# Patient Record
Sex: Male | Born: 1945 | Marital: Married | Smoking: Former smoker
Health system: Southern US, Community
[De-identification: ages and names within clinical notes are randomized; demographics above are authoritative.]

## PROBLEM LIST (undated history)

## (undated) DIAGNOSIS — I10 Essential (primary) hypertension: Secondary | ICD-10-CM

## (undated) HISTORY — PX: HERNIA REPAIR: SHX51

## (undated) HISTORY — DX: Essential (primary) hypertension: I10

---

## 2020-01-28 DIAGNOSIS — Z79899 Other long term (current) drug therapy: Secondary | ICD-10-CM | POA: Insufficient documentation

## 2020-01-28 DIAGNOSIS — G894 Chronic pain syndrome: Secondary | ICD-10-CM | POA: Insufficient documentation

## 2020-01-28 DIAGNOSIS — M899 Disorder of bone, unspecified: Secondary | ICD-10-CM | POA: Insufficient documentation

## 2020-01-28 DIAGNOSIS — Z789 Other specified health status: Secondary | ICD-10-CM | POA: Insufficient documentation

## 2020-01-28 NOTE — Patient Instructions (Addendum)
____________________________________________________________________________________________  General Risks and Possible Complications  Patient Responsibilities: It is important that you read this as it is part of your informed consent. It is our duty to inform you of the risks and possible complications associated with treatments offered to you. It is your responsibility as a patient to read this and to ask questions about anything that is not clear or that you believe was not covered in this document.  Patient's Rights: You have the right to refuse treatment. You also have the right to change your mind, even after initially having agreed to have the treatment done. However, under this last option, if you wait until the last second to change your mind, you may be charged for the materials used up to that point.  Introduction: Medicine is not an exact science. Everything in Medicine, including the lack of treatment(s), carries the potential for danger, harm, or loss (which is by definition: Risk). In Medicine, a complication is a secondary problem, condition, or disease that can aggravate an already existing one. All treatments carry the risk of possible complications. The fact that a side effects or complications occurs, does not imply that the treatment was conducted incorrectly. It must be clearly understood that these can happen even when everything is done following the highest safety standards.  No treatment: You can choose not to proceed with the proposed treatment alternative. The "PRO(s)" would include: avoiding the risk of complications associated with the therapy. The "CON(s)" would include: not getting any of the treatment benefits. These benefits fall under one of three categories: diagnostic; therapeutic; and/or palliative. Diagnostic benefits include: getting information which can ultimately lead to improvement of the disease or symptom(s). Therapeutic benefits are those associated with the  successful treatment of the disease. Finally, palliative benefits are those related to the decrease of the primary symptoms, without necessarily curing the condition (example: decreasing the pain from a flare-up of a chronic condition, such as incurable terminal cancer).  General Risks and Complications: These are associated to most interventional treatments. They can occur alone, or in combination. They fall under one of the following six (6) categories: no benefit or worsening of symptoms; bleeding; infection; nerve damage; allergic reactions; and/or death. 1. No benefits or worsening of symptoms: In Medicine there are no guarantees, only probabilities. No healthcare provider can ever guarantee that a medical treatment will work, they can only state the probability that it may. Furthermore, there is always the possibility that the condition may worsen, either directly, or indirectly, as a consequence of the treatment. 2. Bleeding: This is more common if the patient is taking a blood thinner, either prescription or over the counter (example: Goody Powders, Fish oil, Aspirin, Garlic, etc.), or if suffering a condition associated with impaired coagulation (example: Hemophilia, cirrhosis of the liver, low platelet counts, etc.). However, even if you do not have one on these, it can still happen. If you have any of these conditions, or take one of these drugs, make sure to notify your treating physician. 3. Infection: This is more common in patients with a compromised immune system, either due to disease (example: diabetes, cancer, human immunodeficiency virus [HIV], etc.), or due to medications or treatments (example: therapies used to treat cancer and rheumatological diseases). However, even if you do not have one on these, it can still happen. If you have any of these conditions, or take one of these drugs, make sure to notify your treating physician. 4. Nerve Damage: This is more common when the   treatment is  an invasive one, but it can also happen with the use of medications, such as those used in the treatment of cancer. The damage can occur to small secondary nerves, or to large primary ones, such as those in the spinal cord and brain. This damage may be temporary or permanent and it may lead to impairments that can range from temporary numbness to permanent paralysis and/or brain death. 5. Allergic Reactions: Any time a substance or material comes in contact with our body, there is the possibility of an allergic reaction. These can range from a mild skin rash (contact dermatitis) to a severe systemic reaction (anaphylactic reaction), which can result in death. 6. Death: In general, any medical intervention can result in death, most of the time due to an unforeseen complication. ____________________________________________________________________________________________  ____________________________________________________________________________________________  Preparing for Procedure with Sedation  Procedure appointments are limited to planned procedures: . No Prescription Refills. . No disability issues will be discussed. . No medication changes will be discussed.  Instructions: . Oral Intake: Do not eat or drink anything for at least 8 hours prior to your procedure. (Exception: Blood Pressure Medication. See below.) . Transportation: Unless otherwise stated by your physician, you may drive yourself after the procedure. . Blood Pressure Medicine: Do not forget to take your blood pressure medicine with a sip of water the morning of the procedure. If your Diastolic (lower reading)is above 100 mmHg, elective cases will be cancelled/rescheduled. . Blood thinners: These will need to be stopped for procedures. Notify our staff if you are taking any blood thinners. Depending on which one you take, there will be specific instructions on how and when to stop it. . Diabetics on insulin: Notify the staff  so that you can be scheduled 1st case in the morning. If your diabetes requires high dose insulin, take only  of your normal insulin dose the morning of the procedure and notify the staff that you have done so. . Preventing infections: Shower with an antibacterial soap the morning of your procedure. . Build-up your immune system: Take 1000 mg of Vitamin C with every meal (3 times a day) the day prior to your procedure. . Antibiotics: Inform the staff if you have a condition or reason that requires you to take antibiotics before dental procedures. . Pregnancy: If you are pregnant, call and cancel the procedure. . Sickness: If you have a cold, fever, or any active infections, call and cancel the procedure. . Arrival: You must be in the facility at least 30 minutes prior to your scheduled procedure. . Children: Do not bring children with you. . Dress appropriately: Bring dark clothing that you would not mind if they get stained. . Valuables: Do not bring any jewelry or valuables.  Reasons to call and reschedule or cancel your procedure: (Following these recommendations will minimize the risk of a serious complication.) . Surgeries: Avoid having procedures within 2 weeks of any surgery. (Avoid for 2 weeks before or after any surgery). . Flu Shots: Avoid having procedures within 2 weeks of a flu shots or . (Avoid for 2 weeks before or after immunizations). . Barium: Avoid having a procedure within 7-10 days after having had a radiological study involving the use of radiological contrast. (Myelograms, Barium swallow or enema study). . Heart attacks: Avoid any elective procedures or surgeries for the initial 6 months after a "Myocardial Infarction" (Heart Attack). . Blood thinners: It is imperative that you stop these medications before procedures. Let us know if you   if you take any blood thinner.  . Infection: Avoid procedures during or within two weeks of an infection (including chest colds or  gastrointestinal problems). Symptoms associated with infections include: Localized redness, fever, chills, night sweats or profuse sweating, burning sensation when voiding, cough, congestion, stuffiness, runny nose, sore throat, diarrhea, nausea, vomiting, cold or Flu symptoms, recent or current infections. It is specially important if the infection is over the area that we intend to treat. Marland Kitchen Heart and lung problems: Symptoms that may suggest an active cardiopulmonary problem include: cough, chest pain, breathing difficulties or shortness of breath, dizziness, ankle swelling, uncontrolled high or unusually low blood pressure, and/or palpitations. If you are experiencing any of these symptoms, cancel your procedure and contact your primary care physician for an evaluation.  Remember:  Regular Business hours are:  Monday to Thursday 8:00 AM to 4:00 PM  Provider's Schedule: Milinda Pointer, MD:  Procedure days: Tuesday and Thursday 7:30 AM to 4:00 PM  Gillis Santa, MD:  Procedure days: Monday and Wednesday 7:30 AM to 4:00 PM ____________________________________________________________________________________________   ____________________________________________________________________________________________  Drug Holidays (Slow)  What is a "Drug Holiday"? Drug Holiday: is the name given to the period of time during which a patient stops taking a medication(s) for the purpose of eliminating tolerance to the drug.  Benefits . Improved effectiveness of opioids. . Decreased opioid dose needed to achieve benefits. . Improved pain with lesser dose.  What is tolerance? Tolerance: is the progressive decreased in effectiveness of a drug due to its repetitive use. With repetitive use, the body gets use to the medication and as a consequence, it loses its effectiveness. This is a common problem seen with opioid pain medications. As a result, a larger dose of the drug is needed to achieve the same  effect that used to be obtained with a smaller dose.  How long should a "Drug Holiday" last? You should stay off of the pain medicine for at least 14 consecutive days. (2 weeks)  Should I stop the medicine "cold [place]"? No. You should always coordinate with your Pain Specialist so that he/she can provide you with the correct medication dose to make the transition as smoothly as possible.  How do I stop the medicine? Slowly. You will be instructed to decrease the daily amount of pills that you take by one (1) pill every seven (7) days. This is called a "slow downward taper" of your dose. For example: if you normally take four (4) pills per day, you will be asked to drop this dose to three (3) pills per day for seven (7) days, then to two (2) pills per day for seven (7) days, then to one (1) per day for seven (7) days, and at the end of those last seven (7) days, this is when the "Drug Holiday" would start.   Will I have withdrawals? By doing a "slow downward taper" like this one, it is unlikely that you will experience any significant withdrawal symptoms. Typically, what triggers withdrawals is the sudden stop of a high dose opioid therapy. Withdrawals can usually be avoided by slowly decreasing the dose over a prolonged period of time.  What are withdrawals? Withdrawals: refers to the wide range of symptoms that occur after stopping or dramatically reducing opiate drugs after heavy and prolonged use. Withdrawal symptoms do not occur to patients that use low dose opioids, or those who take the medication sporadically. Contrary to benzodiazepine (example: Valium, Xanax, etc.) or alcohol withdrawals ("Delirium Tremens"), opioid withdrawals  are not lethal. Withdrawals are the physical manifestation of the body getting rid of the excess receptors.  Expected Symptoms Early symptoms of withdrawal may include: . Agitation . Anxiety . Muscle aches . Increased tearing . Insomnia . Runny  nose . Sweating . Yawning  Late symptoms of withdrawal may include: . Abdominal cramping . Diarrhea . Dilated pupils . Goose bumps . Nausea . Vomiting  Will I experience withdrawals? Due to the slow nature of the taper, it is very unlikely that you will experience any.  What is a slow taper? Taper: refers to the gradual decrease in dose.  ___________________________________________________________________________________________  Please get your x-rays done as soon as possible.

## 2020-01-28 NOTE — Progress Notes (Signed)
Patient: Albert Gordon  Service Category: E/M  Provider: Gaspar Cola, MD  DOB: [birth year]  DOS: 01/29/2020  Referring Provider: [facility]  MRN: [id number]  Setting: Ambulatory outpatient  PCP: Ferdie Ping, MD  Type: New Patient  Specialty: Interventional Pain Management    Location: Office  Delivery: Face-to-face     Primary Reason(s) for Visit: Encounter for initial evaluation of one or more chronic problems (new to examiner) potentially causing chronic pain, and posing a threat to normal musculoskeletal function. (Level of risk: High) CC: Back Pain (lower)  HPI  [name] is a 74 y.o. year old, male patient, who comes today to see [place] for the first time for an initial evaluation of his chronic pain. He has Chronic pain syndrome; Pharmacologic therapy; Disorder of skeletal system; Problems influencing health status; Chronic low back pain (1ry area of Pain) (Bilateral) (L>R) w/o sciatica; Lumbar facet syndrome (Bilateral) (L>R); Chronic lower extremity pain (Bilateral) (L>R); Chronic hip pain (Left); Chronic sacroiliac joint pain (Bilateral) (L>R); and Somatic dysfunction of sacroiliac joints (Bilateral) on their problem list. Today he comes in for evaluation of his Back Pain (lower)  Pain Assessment: Location: Lower Back Radiating: both legs to just above the knee Onset: More than a month ago Duration: Chronic pain Quality: Other (Comment) (agonizing) Severity: 6 /10 (subjective, self-reported pain score)  Note: Reported level is compatible with observation.                         When using our objective Pain Scale, levels between 6 and 10/10 are said to belong in an emergency room, as it progressively worsens from a 6/10, described as severely limiting, requiring emergency care not usually available at an outpatient pain management facility. At a 6/10 level, communication becomes difficult and requires great effort. Assistance to reach the emergency department may be  required. Facial flushing and profuse sweating along with potentially dangerous increases in heart rate and blood pressure will be evident. Effect on ADL: difficulty performing daily activities Timing: Constant Modifying factors: medications BP: 128/74  HR: (!) 58  Onset and Duration: Present longer than 3 months Cause of pain: Unknown Severity: Getting worse, NAS-11 at its worse: 10/10, NAS-11 at its best: 4/10, NAS-11 now: 4/10 and NAS-11 on the average: 6/10  Aggravating Factors: Prolonged standing Alleviating Factors: Hot packs, Medications and Warm showers or baths Associated Problems: Night-time cramps, Fatigue and Pain that wakes patient up Quality of Pain: Agonizing, Deep, Dreadful and Shooting Previous Examinations or Tests: MRI scan and Chiropractic evaluation Previous Treatments: Chiropractic manipulations, Epidural steroid injections, Narcotic medications and Stretching exercises  The patient comes into the clinics today for the first time for a chronic pain management evaluation.  According to the patient the primary area of pain is that of the lower back, bilaterally, with the pain started in the midline.  Enter left more than right.  The patient denies any surgeries, but does admit to nerve blocks approximately 2 to 3 years ago.  He indicates that they did not help.  He does admit to having had 2 MRIs at the [facility] hospital in [place] the last of which was approximately 6 months ago.  He also refers being rather active and racing his grandchildren.  The patient's secondary area pain is that of the lower extremities, bilaterally, (L>R) in the case of the left lower extremity the pain goes through the back of the leg to just above the knee.  The distribution of the pain on the right side is identical to the left facet that not as bad.  He denies having had any EMG/PNCV's but does admit to having tried some TENS units and acupuncture.  He describes not taking any type of blood  thinners.  Currently he has been managing his pain with extended release morphine close hydrocodone/APAP which he has taken nonstop for the past 8 years.  He recognizes that he has developed some tolerance to the medication.  Provocative physical exam today was positive for pain arising from the left hip on the Patrick maneuver with also bilateral sacroiliac joint pain (Bilateral) lumbar facet joint pain on hyperextension on rotation provocative maneuvers.  Straight leg raise was negative and equal bilaterally.  Lumbar flexion was within normal limits where he was able to reach to floor.  He does not seem to be having any signs or symptoms of any type of lower lumbar radiculopathy.  The patient complains of electrical-like sensations but when he was asked if he took any gabapentin (Neurontin) he indicated that he tried it and it did not work.  I then proceeded to ask him about pregabalin and he simply said that he has tried everything and nothing has worked.  Specifically with regards to the gabapentin he again said that he tried it but it did not work.  Today he indicated that the physicians at the [facility] would like to have him off of the opioids but that he has told them that he needs "something" for the pain.  I took this opportunity to talk to the patient about "Drug Holidays". I took the opportunity to explain the concept of tolerance and how "Drug Holidays" help control it.  I detailed how to do a slow taper to avoid withdrawals.  My initial impression is that this is not an idea that he wanted to entertain.  I also asked the patient if he was going to continue getting his pain medication at the [facility] since I had no intention of taking over those medications without doing a  "Drug Holiday".  Again, the body language spoke by itself.  Today I took the time to provide the patient with information regarding my pain practice. The patient was informed that my practice is divided into two  sections: an interventional pain management section, as well as a completely separate and distinct medication management section. I explained that I have procedure days for my interventional therapies, and evaluation days for follow-ups and medication management. Because of the amount of documentation required during both, they are kept separated. This means that there is the possibility that he may be scheduled for a procedure on one day, and medication management the next. I have also informed him that because of staffing and facility limitations, I no longer take patients for medication management only. To illustrate the reasons for this, I gave the patient the example of surgeons, and how inappropriate it would be to refer a patient to his/her care, just to write for the post-surgical antibiotics on a surgery done by a different surgeon.   Because interventional pain management is my board-certified specialty, the patient was informed that joining my practice means that they are open to any and all interventional therapies. I made it clear that this does not mean that they will be forced to have any procedures done. What this means is that I believe interventional therapies to be essential part of the diagnosis and proper management of chronic pain  conditions. Therefore, patients not interested in these interventional alternatives will be better served under the care of a different practitioner.  The patient was also made aware of my Comprehensive Pain Management Safety Guidelines where by joining my practice, they limit all of their nerve blocks and joint injections to those done by our practice, for as long as we are retained to manage their care.   Historic Controlled Substance Pharmacotherapy Review  PMP and historical list of controlled substances: Morphine ER 15 mg daily + hydrocodone/APAP 10/325 1 tablet every 8 hours (45 MME/day) Current opioid analgesics: Morphine ER 15 mg daily +  hydrocodone/APAP 10/325 1 tablet every 8 hours (45 MME/day)  MME/day: 45 mg/day  Medications: The patient did not bring the medication(s) to the appointment, as requested in our "New Patient Package" Pharmacodynamics: Desired effects: Analgesia: The patient reports >50% benefit. Reported improvement in function: The patient reports medication allows him to accomplish basic ADLs. Clinically meaningful improvement in function (CMIF): Sustained CMIF goals met Perceived effectiveness: Described as relatively effective, allowing for increase in activities of daily living (ADL) Undesirable effects: Side-effects or Adverse reactions: None reported Historical Monitoring: The patient  reports previous drug use. List of all UDS Test(s): No results found. List of other Serum/Urine Drug Screening Test(s):  No results found. Historical Background Evaluation: [place] PMP: PDMP reviewed during this encounter. Six (6) year initial data search conducted.             PMP NARX Score Report:  Narcotic: 412 Sedative: 150 Stimulant: 000 [place] Department of public safety, offender search: Editor, commissioning Information) Non-contributory Risk Assessment Profile: Aberrant behavior: None observed or detected today Risk factors for fatal opioid overdose: None identified today PMP NARX Overdose Risk Score: 060 Fatal overdose hazard ratio (HR): Calculation deferred Non-fatal overdose hazard ratio (HR): Calculation deferred Risk of opioid abuse or dependence: 0.7-3.0% with doses ? 36 MME/day and 6.1-26% with doses ? 120 MME/day. Substance use disorder (SUD) risk level: See below Personal History of Substance Abuse (SUD-Substance use disorder):  Alcohol: Negative  Illegal Drugs: Negative  Rx Drugs: Negative  ORT Risk Level calculation: Low Risk  Opioid Risk Tool - 01/29/20 1030      Family History of Substance Abuse   Alcohol Negative    Illegal Drugs Positive Male    Rx Drugs Negative      Personal History of Substance  Abuse   Alcohol Negative    Illegal Drugs Negative    Rx Drugs Negative      Age   Age between 74-45 years  No      History of Preadolescent Sexual Abuse   History of Preadolescent Sexual Abuse Negative or Male      Psychological Disease   Psychological Disease Negative    Depression Negative      Total Score   Opioid Risk Tool Scoring 3    Opioid Risk Interpretation Low Risk          ORT Scoring interpretation table:  Score <3 = Low Risk for SUD  Score between 4-7 = Moderate Risk for SUD  Score >8 = High Risk for Opioid Abuse   PHQ-2 Depression Scale:  Total score: 0  PHQ-2 Scoring interpretation table: (Score and probability of major depressive disorder)  Score 0 = No depression  Score 1 = 15.4% Probability  Score 2 = 21.1% Probability  Score 3 = 38.4% Probability  Score 4 = 45.5% Probability  Score 5 = 56.4% Probability  Score 6 = 78.6% Probability  PHQ-9 Depression Scale:  Total score: 0  PHQ-9 Scoring interpretation table:  Score 0-4 = No depression  Score 5-9 = Mild depression  Score 10-14 = Moderate depression  Score 15-19 = Moderately severe depression  Score 20-27 = Severe depression (2.4 times higher risk of SUD and 2.89 times higher risk of overuse)   Pharmacologic Plan: As per protocol, I have not taken over any controlled substance management, pending the results of ordered tests and/or consults.            Initial impression: Pending review of available data and ordered tests.  Meds   Current Outpatient Medications:  .  atorvastatin (LIPITOR) 80 MG tablet, Take 80 mg by mouth daily., Disp: , Rfl:  .  HYDROcodone-acetaminophen (NORCO) 10-325 MG tablet, Take 1 tablet by mouth every 8 (eight) hours as needed., Disp: , Rfl:  .  morphine (MSIR) 15 MG tablet, Take 15 mg by mouth at bedtime., Disp: , Rfl:  .  tamsulosin (FLOMAX) 0.4 MG CAPS capsule, Take 0.4 mg by mouth daily., Disp: , Rfl:  .  terazosin (HYTRIN) 2 MG capsule, Take 2 mg by mouth at  bedtime. 2 capsules at bedtimr, Disp: , Rfl:  .  triamterene-hydrochlorothiazide (MAXZIDE-25) 37.5-25 MG tablet, Take 1 tablet by mouth daily., Disp: , Rfl:   Imaging Review   Complexity Note: No results found under the [facility] record.                        ROS  Cardiovascular: High blood pressure Pulmonary or Respiratory: Snoring  Neurological: No reported neurological signs or symptoms such as seizures, abnormal skin sensations, urinary and/or fecal incontinence, being born with an abnormal open spine and/or a tethered spinal cord Psychological-Psychiatric: No reported psychological or psychiatric signs or symptoms such as difficulty sleeping, anxiety, depression, delusions or hallucinations (schizophrenial), mood swings (bipolar disorders) or suicidal ideations or attempts Gastrointestinal: No reported gastrointestinal signs or symptoms such as vomiting or evacuating blood, reflux, heartburn, alternating episodes of diarrhea and constipation, inflamed or scarred liver, or pancreas or irrregular and/or infrequent bowel movements Genitourinary: No reported renal or genitourinary signs or symptoms such as difficulty voiding or producing urine, peeing blood, non-functioning kidney, kidney stones, difficulty emptying the bladder, difficulty controlling the flow of urine, or chronic kidney disease Hematological: No reported hematological signs or symptoms such as prolonged bleeding, low or poor functioning platelets, bruising or bleeding easily, hereditary bleeding problems, low energy levels due to low hemoglobin or being anemic Endocrine: No reported endocrine signs or symptoms such as high or low blood sugar, rapid heart rate due to high thyroid levels, obesity or weight gain due to slow thyroid or thyroid disease Rheumatologic: No reported rheumatological signs and symptoms such as fatigue, joint pain, tenderness, swelling, redness, heat, stiffness, decreased range of  motion, with or without associated rash Musculoskeletal: Negative for myasthenia gravis, muscular dystrophy, multiple sclerosis or malignant hyperthermia Work History: Retired  Allergies  Mr. Delellis has No Known Allergies.  Laboratory Chemistry Profile   Renal No results found for: BUN, CREATININE, LABCREA, BCR, GFR, GFRAA, GFRNONAA, SPECGRAV, PHUR, PROTEINUR   Electrolytes No results found for: NA, K, CL, CALCIUM, MG, PHOS   Hepatic No results found for: AST, ALT, ALBUMIN, ALKPHOS, AMYLASE, LIPASE, AMMONIA   ID No results found for: LYMEIGGIGMAB, HIV, SARSCOV2NAA, STAPHAUREUS, MRSAPCR, HCVAB, PREGTESTUR, RMSFIGG, QFVRPH1IGG, QFVRPH2IGG, LYMEIGGIGMAB   Bone No results found for: [place], [id number], PI9518AC1, [id number], 25OHVITD1, 25OHVITD2, 25OHVITD3, TESTOFREE, TESTOSTERONE  Endocrine No results found for: GLUCOSE, GLUCOSEU, HGBA1C, TSH, FREET4, TESTOFREE, TESTOSTERONE, SHBG, ESTRADIOL, ESTRADIOLPCT, ESTRADIOLFRE, LABPREG, ACTH, CRTSLPL, UCORFRPERLTR, UCORFRPERDAY, CORTISOLBASE, LABPREG   Neuropathy No results found for: VITAMINB12, FOLATE, HGBA1C, HIV   CNS No results found for: COLORCSF, APPEARCSF, RBCCOUNTCSF, WBCCSF, POLYSCSF, LYMPHSCSF, EOSCSF, PROTEINCSF, GLUCCSF, JCVIRUS, CSFOLI, IGGCSF, LABACHR, ACETBL, LABACHR, ACETBL   Inflammation (CRP: Acute  ESR: Chronic) No results found for: CRP, ESRSEDRATE, LATICACIDVEN   Rheumatology No results found for: RF, ANA, LABURIC, URICUR, LYMEIGGIGMAB, LYMEABIGMQN, HLAB27   Coagulation No results found for: INR, LABPROT, APTT, PLT, DDIMER, LABHEMA, VITAMINK1, AT3   Cardiovascular No results found for: BNP, CKTOTAL, CKMB, TROPONINI, HGB, HCT, LABVMA, EPIRU, EPINEPH24HUR, NOREPRU, NOREPI24HUR, DOPARU, DOPAM24HRUR   Screening No results found for: [place], COVIDSOURCE, STAPHAUREUS, MRSAPCR, HCVAB, HIV, PREGTESTUR   Cancer No results found for: CEA, CA125, LABCA2   Allergens No results found for: ALMOND, APPLE, ASPARAGUS,  AVOCADO, BANANA, BARLEY, BASIL, BAYLE[place], GREENBEAN, LIMABEAN, WHITEBEAN, BEEFIGE, REDBEET, BLUEBERRY, BROCCOLI, CABBAGE, MELON, CARROT, CASEIN, CASHEWNUT, CAULIFLOWER, CELERY     Note: No results found under the [employer] electronic medical record  [facility]  Drug: Mr. Villwock  reports previous drug use. Alcohol:  reports current alcohol use. Tobacco:  reports that he has quit smoking. He has quit using smokeless tobacco. Medical:  has a past medical history of Hypertension. Family: family history is not on file.   The histories are not reviewed yet. Please review them in the "History" navigator section and refresh this [place]. Active Ambulatory Problems    Diagnosis Date Noted  . Chronic pain syndrome 01/28/2020  . Pharmacologic therapy 01/28/2020  . Disorder of skeletal system 01/28/2020  . Problems influencing health status 01/28/2020  . Chronic low back pain (1ry area of Pain) (Bilateral) (L>R) w/o sciatica 01/29/2020  . Lumbar facet syndrome (Bilateral) (L>R) 01/29/2020  . Chronic lower extremity pain (Bilateral) (L>R) 01/29/2020  . Chronic hip pain (Left) 01/29/2020  . Chronic sacroiliac joint pain (Bilateral) (L>R) 01/29/2020  . Somatic dysfunction of sacroiliac joints (Bilateral) 01/29/2020   Resolved Ambulatory Problems    Diagnosis Date Noted  . No Resolved Ambulatory Problems   Past Medical History:  Diagnosis Date  . Hypertension    Constitutional Exam  General appearance: Well nourished, well developed, and well hydrated. In no apparent acute distress Vitals:   01/29/20 1020  BP: 128/74  Pulse: (!) 58  Resp: 16  Temp: (!) 97.5 F (36.4 C)  TempSrc: Temporal  SpO2: 98%  Weight: 170 lb (77.1 kg)  Height: '5\' 10"'  (1.778 m)   BMI Assessment: Estimated body mass index is 24.39 kg/m as calculated from the following:   Height as of this encounter: '5\' 10"'  (1.778 m).   Weight as of this encounter: 170 lb (77.1 kg).  BMI interpretation table: BMI level  Category Range association with higher incidence of chronic pain  <18 kg/m2 Underweight   18.5-24.9 kg/m2 Ideal body weight   25-29.9 kg/m2 Overweight Increased incidence by 20%  30-34.9 kg/m2 Obese (Class I) Increased incidence by 68%  35-39.9 kg/m2 Severe obesity (Class II) Increased incidence by 136%  >40 kg/m2 Extreme obesity (Class III) Increased incidence by 254%   Patient's current BMI Ideal Body weight  Body mass index is 24.39 kg/m. Ideal body weight: 73 kg (160 lb 15 oz) Adjusted ideal body weight: 74.6 kg (164 lb 9 oz)   BMI Readings from Last 4 Encounters:  01/29/20 24.39 kg/m   Wt Readings from Last 4 Encounters:  01/29/20 170 lb (  77.1 kg)    Psych/Mental status: Alert, oriented x 3 (person, place, & time)       Eyes: PERLA Respiratory: No evidence of acute respiratory distress  Lumbar Exam  Skin & Axial Inspection: No masses, redness, or swelling Alignment: Symmetrical Functional ROM: Decreased ROM affecting both sides on hyperextension and rotation. Stability: No instability detected Muscle Tone/Strength: Guarding detected Sensory (Neurological): Movement-associated discomfort Palpation: Complains of area being tender to palpation       Provocative Tests: Hyperextension/rotation test: (+) bilaterally for facet joint pain. Lumbar quadrant test (Kemp's test): (+) bilaterally for facet joint pain. Lateral bending test: (-)       Patrick's Maneuver: (+) for bilateral S-I arthralgia and for left hip arthralgia FABER* test: (+) for bilateral S-I arthralgia and for left hip arthralgia S-I anterior distraction/compression test: deferred today         S-I lateral compression test: deferred today         S-I Thigh-thrust test: deferred today         S-I Gaenslen's test: deferred today         *(Flexion, ABduction and External Rotation)  Gait & Posture Assessment  Ambulation: Unassisted Gait: Relatively normal for age and body habitus Posture: WNL   Lower  Extremity Exam    Side: Right lower extremity  Side: Left lower extremity  Stability: No instability observed          Stability: No instability observed          Skin & Extremity Inspection: Skin color, temperature, and hair growth are WNL. No peripheral edema or cyanosis. No masses, redness, swelling, asymmetry, or associated skin lesions. No contractures.  Skin & Extremity Inspection: Skin color, temperature, and hair growth are WNL. No peripheral edema or cyanosis. No masses, redness, swelling, asymmetry, or associated skin lesions. No contractures.  Functional ROM: Decreased ROM for hip joint          Functional ROM: Decreased ROM for hip joint          Muscle Tone/Strength: Able to Toe-walk & Heel-walk without problems  Muscle Tone/Strength: Able to Toe-walk & Heel-walk without problems  Sensory (Neurological): Referred pain pattern        Sensory (Neurological): Referred pain pattern        DTR: Patellar: deferred today Achilles: deferred today Plantar: deferred today  DTR: Patellar: deferred today Achilles: deferred today Plantar: deferred today  Palpation: No palpable anomalies  Palpation: No palpable anomalies   Assessment  Primary Diagnosis & Pertinent Problem List: The primary encounter diagnosis was Chronic pain syndrome. Diagnoses of Pharmacologic therapy, Disorder of skeletal system, Problems influencing health status, Chronic low back pain (1ry area of Pain) (Bilateral) (L>R) w/o sciatica, Lumbar facet syndrome (Bilateral) (L>R), Chronic lower extremity pain (Bilateral) (L>R), Chronic hip pain (Left), Chronic sacroiliac joint pain (Bilateral) (L>R), and Somatic dysfunction of sacroiliac joints (Bilateral) were also pertinent to this visit.  Visit Diagnosis (New problems to examiner): 1. Chronic pain syndrome   2. Pharmacologic therapy   3. Disorder of skeletal system   4. Problems influencing health status   5. Chronic low back pain (1ry area of Pain) (Bilateral) (L>R) w/o  sciatica   6. Lumbar facet syndrome (Bilateral) (L>R)   7. Chronic lower extremity pain (Bilateral) (L>R)   8. Chronic hip pain (Left)   9. Chronic sacroiliac joint pain (Bilateral) (L>R)   10. Somatic dysfunction of sacroiliac joints (Bilateral)    Plan of Care (Initial workup plan)  Note: Mr.  Fortin was reminded that as per protocol, today's visit has been an evaluation only. We have not taken over the patient's controlled substance management.  Problem-specific plan: No problem-specific Assessment & Plan notes found for this encounter.   Lab Orders     Compliance Drug Analysis, Ur     Comp. Metabolic Panel (12)     Magnesium     Vitamin B12     Sedimentation rate     25-Hydroxy vitamin D Lcms D2+D3     C-reactive protein  Imaging Orders     DG Lumbar Spine Complete W/Bend     DG Si Joints     DG HIP UNILAT W OR W/O PELVIS 2-3 VIEWS LEFT Referral Orders  No referral(s) requested today    Procedure Orders     LUMBAR FACET(MEDIAL BRANCH NERVE BLOCK) MBNB Pharmacotherapy (current): Medications ordered:  No orders of the defined types were placed in this encounter.  Medications administered during this visit: Mohsin Crum. Tomasik had no medications administered during this visit.   Pharmacological management options:  Opioid Analgesics: The patient was informed that there is no guarantee that he would be a candidate for opioid analgesics. The decision will be made following CDC guidelines. This decision will be based on the results of diagnostic studies, as well as Mr. Chavira risk profile.   Membrane stabilizer: To be determined at a later time  Muscle relaxant: To be determined at a later time  NSAID: To be determined at a later time  Other analgesic(s): To be determined at a later time   Interventional management options: Mr. Hermiz was informed that there is no guarantee that he would be a candidate for interventional therapies. The decision will be based on the results of  diagnostic studies, as well as Mr. Karasik risk profile.  Procedure(s) under consideration:  Diagnostic bilateral lumbar facet block #1  Possible bilateral lumbar facet RFA  Diagnostic bilateral SI joint block #1  Possible bilateral SI joint RFA  Diagnostic left IA hip joint injection    Provider-requested follow-up: Return for Procedure (w/ sedation): (B) L-FCT BLK #1.  No future appointments.  Note by: Gaspar Cola, MD Date: 01/29/2020; Time: 4:44 PM

## 2020-01-29 ENCOUNTER — Ambulatory Visit: Payer: No Typology Code available for payment source | Attending: Pain Medicine | Admitting: Pain Medicine

## 2020-01-29 ENCOUNTER — Inpatient Hospital Stay: Admit: 2020-01-29 | Payer: No Typology Code available for payment source

## 2020-01-29 ENCOUNTER — Other Ambulatory Visit: Payer: Self-pay

## 2020-01-29 ENCOUNTER — Encounter: Payer: Self-pay | Admitting: Pain Medicine

## 2020-01-29 VITALS — BP 128/74 | HR 58 | Temp 97.5°F | Resp 16 | Ht 70.0 in | Wt 170.0 lb

## 2020-01-29 DIAGNOSIS — Z79899 Other long term (current) drug therapy: Secondary | ICD-10-CM | POA: Diagnosis not present

## 2020-01-29 DIAGNOSIS — Z789 Other specified health status: Secondary | ICD-10-CM

## 2020-01-29 DIAGNOSIS — M545 Low back pain, unspecified: Secondary | ICD-10-CM

## 2020-01-29 DIAGNOSIS — M79605 Pain in left leg: Secondary | ICD-10-CM | POA: Insufficient documentation

## 2020-01-29 DIAGNOSIS — M25552 Pain in left hip: Secondary | ICD-10-CM | POA: Diagnosis present

## 2020-01-29 DIAGNOSIS — M47816 Spondylosis without myelopathy or radiculopathy, lumbar region: Secondary | ICD-10-CM | POA: Diagnosis present

## 2020-01-29 DIAGNOSIS — M79604 Pain in right leg: Secondary | ICD-10-CM | POA: Diagnosis present

## 2020-01-29 DIAGNOSIS — M9904 Segmental and somatic dysfunction of sacral region: Secondary | ICD-10-CM

## 2020-01-29 DIAGNOSIS — M533 Sacrococcygeal disorders, not elsewhere classified: Secondary | ICD-10-CM | POA: Insufficient documentation

## 2020-01-29 DIAGNOSIS — G894 Chronic pain syndrome: Secondary | ICD-10-CM | POA: Diagnosis not present

## 2020-01-29 DIAGNOSIS — M899 Disorder of bone, unspecified: Secondary | ICD-10-CM | POA: Diagnosis not present

## 2020-01-29 DIAGNOSIS — G8929 Other chronic pain: Secondary | ICD-10-CM | POA: Diagnosis present

## 2020-01-29 NOTE — Progress Notes (Signed)
Safety precautions to be maintained throughout the outpatient stay will include: orient to surroundings, keep bed in low position, maintain call bell within reach at all times, provide assistance with transfer out of bed and ambulation.  

## 2020-02-01 ENCOUNTER — Other Ambulatory Visit: Payer: Self-pay

## 2020-02-01 LAB — COMPLIANCE DRUG ANALYSIS, UR

## 2020-02-06 ENCOUNTER — Ambulatory Visit
Admission: RE | Admit: 2020-02-06 | Discharge: 2020-02-06 | Disposition: A | Discharge status: Home / Self Care | Payer: No Typology Code available for payment source | Source: Ambulatory Visit | Attending: Pain Medicine | Admitting: Pain Medicine

## 2020-02-06 ENCOUNTER — Ambulatory Visit
Admission: RE | Admit: 2020-02-06 | Discharge: 2020-02-06 | Disposition: A | Discharge status: Home / Self Care | Payer: No Typology Code available for payment source | Attending: Pain Medicine | Admitting: Pain Medicine

## 2020-02-06 DIAGNOSIS — M9904 Segmental and somatic dysfunction of sacral region: Secondary | ICD-10-CM | POA: Diagnosis present

## 2020-02-06 DIAGNOSIS — G8929 Other chronic pain: Secondary | ICD-10-CM | POA: Insufficient documentation

## 2020-02-06 DIAGNOSIS — M47816 Spondylosis without myelopathy or radiculopathy, lumbar region: Secondary | ICD-10-CM | POA: Insufficient documentation

## 2020-02-06 DIAGNOSIS — M545 Low back pain: Secondary | ICD-10-CM | POA: Diagnosis not present

## 2020-02-06 DIAGNOSIS — M533 Sacrococcygeal disorders, not elsewhere classified: Secondary | ICD-10-CM | POA: Diagnosis present

## 2020-02-06 DIAGNOSIS — M25552 Pain in left hip: Secondary | ICD-10-CM | POA: Insufficient documentation

## 2020-02-06 LAB — COMP. METABOLIC PANEL (12)
AST: 18 IU/L (ref 0–40)
Albumin/Globulin Ratio: 2 (ref 1.2–2.2)
Albumin: 4.4 g/dL (ref 3.7–4.7)
Alkaline Phosphatase: 50 IU/L (ref 48–121)
BUN/Creatinine Ratio: 16 (ref 10–24)
BUN: 15 mg/dL (ref 8–27)
Bilirubin Total: 0.7 mg/dL (ref 0.0–1.2)
Calcium: 9.4 mg/dL (ref 8.6–10.2)
Chloride: 102 mmol/L (ref 96–106)
Creatinine, Ser: 0.93 mg/dL (ref 0.76–1.27)
GFR calc Af Amer: 93 mL/min/{1.73_m2} (ref >59)
GFR calc non Af Amer: 80 mL/min/{1.73_m2} (ref >59)
Globulin, Total: 2.2 g/dL (ref 1.5–4.5)
Glucose: 105 mg/dL — ABNORMAL HIGH (ref 65–99)
Potassium: 4.8 mmol/L (ref 3.5–5.2)
Sodium: 142 mmol/L (ref 134–144)
Total Protein: 6.6 g/dL (ref 6.0–8.5)

## 2020-02-06 LAB — SEDIMENTATION RATE: Sed Rate: 2 mm/hr (ref 0–30)

## 2020-02-06 LAB — 25-HYDROXY VITAMIN D LCMS D2+D3
25-Hydroxy, Vitamin D-2: <1 ng/mL
25-Hydroxy, Vitamin D-3: 54 ng/mL
25-Hydroxy, Vitamin D: 54 ng/mL

## 2020-02-06 LAB — C-REACTIVE PROTEIN: CRP: <1 mg/L (ref 0–10)

## 2020-02-06 LAB — VITAMIN B12: Vitamin B-12: 256 pg/mL (ref 232–1245)

## 2020-02-06 LAB — MAGNESIUM: Magnesium: 2.3 mg/dL (ref 1.6–2.3)

## 2020-05-01 NOTE — Progress Notes (Signed)
PROVIDER NOTE: Information contained herein reflects review and annotations entered in association with encounter. Interpretation of such information and data should be left to medically-trained personnel. Information provided to patient can be located elsewhere in the medical record under "Patient Instructions". Document created using STT-dictation technology, any transcriptional errors that may result from process are unintentional.    Patient: Albert Gordon  Service Category: Procedure  Provider: Gaspar Cola, MD  DOB: [birth year]  DOS: 05/02/2020  [facility]  MRN: [id number]  Setting: Ambulatory - outpatient  Referring Provider: Ferdie Ping, MD  Type: Established Patient  Specialty: Interventional Pain Management  PCP: Albert Ping, MD   Primary Reason for Visit: Interventional Pain Management Treatment. CC: Back Pain (lower)  Procedure:          Anesthesia, Analgesia, Anxiolysis:  Type: Lumbar Facet, Medial Branch Block(s) #1  Primary Purpose: Diagnostic Region: Posterolateral Lumbosacral Spine Level: L2, L3, L4, L5, & S1 Medial Branch Level(s). Injecting these levels blocks the L3-4, L4-5, and L5-S1 lumbar facet joints. Laterality: Bilateral  Type: Local Anesthesia Indication(s): Analgesia         Route: Infiltration ([place]/IM) IV Access: Declined Sedation: Declined  Local Anesthetic: Lidocaine 1-2%  Position: Prone   Indications: 1. Lumbar facet syndrome (Bilateral) (L>R)   2. Spondylosis without myelopathy or radiculopathy, lumbosacral region   3. Chronic low back pain (1ry area of Pain) (Bilateral) (L>R) w/o sciatica   4. Lumbar facet arthropathy (Multilevel) (Bilateral)   5. Lumbar facet hypertrophy (Multilevel) (Bilateral)   6. DDD (degenerative disc disease), lumbar   7. Spondylosis of lumbar region without myelopathy or radiculopathy   8. Osteoarthritis of facet joint of lumbar spine   9. Osteoarthritis of lumbar spine   10.  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 Decreased range of motion of lumbar spine    Pain Score: Pre-procedure: 8 /10 Post-procedure: 2  (when moving back)/10   According to the patient the primary area of pain is that of the lower back, bilaterally, with the pain started in the midline,  (L>R).  The patient denied any surgeries, but does admit to nerve blocks approximately 2 to 3 years ago.  He indicated that they did not help.  He did admit to having had 2 MRIs at the [facility] hospital in [place] the last of which was approximately 6 months ago.  He also refered being rather active and racing his grandchildren.  The patient's secondary area pain is that of the lower extremities, bilaterally, (L>R) in the case of the left lower extremity the pain goes through the back of the leg to just above the knee.  The distribution of the pain on the right side is identical to the left facet that not as bad.  He denies having had any EMG/PNCV's but does admit to having tried some TENS units and acupuncture.  He describes not taking any type of blood thinners.  Currently he has been managing his pain with extended release morphine close hydrocodone/APAP which he has taken nonstop for the past 8 years.  He recognizes that he has developed some tolerance to the medication.  Provocative physical exam was positive for pain arising from the left hip on the Patrick maneuver with also bilateral sacroiliac joint pain (Bilateral) lumbar facet joint pain on hyperextension on rotation provocative maneuvers.  Straight leg raise was negative and equal bilaterally.  Lumbar flexion was within normal limits where he was able to reach to floor.  He does not seem to be having any signs or symptoms of any type of lower lumbar radiculopathy.  The patient complains of electrical-like sensations but when he was asked if he took any gabapentin (Neurontin) he indicated that he tried it and it did not work.  I then proceeded to  ask him about pregabalin and he simply said that he has tried everything and nothing has worked.  Specifically with regards to the gabapentin he again said that he tried it but it did not work.  Today he indicated that the physicians at the [facility] would like to have him off of the opioids but that he has told them that he needs "something" for the pain.  Today we went over the results of the x-rays.  The hip and the SI joint x-rays were negative.  Lumbar x-rays demonstrated facet arthropathy (degenerative joint disease) and degenerative disc disease.  Pre-op Assessment:  Albert Gordon is a 74 y.o. (year old), male patient, seen today for interventional treatment. He  has a past surgical history that includes Hernia repair. Albert Gordon has a current medication list which includes the following prescription(s): atorvastatin, hydrocodone-acetaminophen, morphine, tamsulosin, terazosin, and triamterene-hydrochlorothiazide. His primarily concern today is the Back Pain (lower)  Initial Vital Signs:  Pulse/HCG Rate: (!) 56  Temp: (!) 97.2 F (36.2 C) Resp: 18 BP: (!) 154/70 SpO2: 99 %  BMI: Estimated body mass index is 24.39 kg/m as calculated from the following:   Height as of this encounter: 5\' 10"  (1.778 m).   Weight as of this encounter: 170 lb (77.1 kg).  Risk Assessment: Allergies: Reviewed. He has No Known Allergies.  Allergy Precautions: None required Coagulopathies: Reviewed. None identified.  Blood-thinner therapy: None at this time Active Infection(s): Reviewed. None identified. Albert Gordon is afebrile  Site Confirmation: Albert Gordon was asked to confirm the procedure and laterality before marking the site Procedure checklist: Completed Consent: Before the procedure and under the influence of no sedative(s), amnesic(s), or anxiolytics, the patient was informed of the treatment options, risks and possible complications. To fulfill our ethical and legal obligations, as recommended by the  American Medical Association's Code of Ethics, I have informed the patient of my clinical impression; the nature and purpose of the treatment or procedure; the risks, benefits, and possible complications of the intervention; the alternatives, including doing nothing; the risk(s) and benefit(s) of the alternative treatment(s) or procedure(s); and the risk(s) and benefit(s) of doing nothing. The patient was provided information about the general risks and possible complications associated with the procedure. These may include, but are not limited to: failure to achieve desired goals, infection, bleeding, organ or nerve damage, allergic reactions, paralysis, and death. In addition, the patient was informed of those risks and complications associated to Spine-related procedures, such as failure to decrease pain; infection (i.e.: Meningitis, epidural or intraspinal abscess); bleeding (i.e.: epidural hematoma, subarachnoid hemorrhage, or any other type of intraspinal or peri-dural bleeding); organ or nerve damage (i.e.: Any type of peripheral nerve, nerve root, or spinal cord injury) with subsequent damage to sensory, motor, and/or autonomic systems, resulting in permanent pain, numbness, and/or weakness of one or several areas of the body; allergic reactions; (i.e.: anaphylactic reaction); and/or death. Furthermore, the patient was informed of those risks and complications associated with the medications. These include, but are not limited to: allergic reactions (i.e.: anaphylactic or anaphylactoid reaction(s)); adrenal axis suppression; blood sugar elevation that in diabetics may result in ketoacidosis or comma; water retention that in patients  with history of congestive heart failure may result in shortness of breath, pulmonary edema, and decompensation with resultant heart failure; weight gain; swelling or edema; medication-induced neural toxicity; particulate matter embolism and blood vessel occlusion with  resultant organ, and/or nervous system infarction; and/or aseptic necrosis of one or more joints. Finally, the patient was informed that Medicine is not an exact science; therefore, there is also the possibility of unforeseen or unpredictable risks and/or possible complications that may result in a catastrophic outcome. The patient indicated having understood very clearly. We have given the patient no guarantees and we have made no promises. Enough time was given to the patient to ask questions, all of which were answered to the patient's satisfaction. Mr. Lisenbee has indicated that he wanted to continue with the procedure. Attestation: I, the ordering provider, attest that I have discussed with the patient the benefits, risks, side-effects, alternatives, likelihood of achieving goals, and potential problems during recovery for the procedure that I have provided informed consent. Date   Time: 05/02/2020  8:52 AM  Pre-Procedure Preparation:  Monitoring: As per clinic protocol. Respiration, ETCO2, SpO2, BP, heart rate and rhythm monitor placed and checked for adequate function Safety Precautions: Patient was assessed for positional comfort and pressure points before starting the procedure. Time-out: I initiated and conducted the "Time-out" before starting the procedure, as per protocol. The patient was asked to participate by confirming the accuracy of the "Time Out" information. Verification of the correct person, site, and procedure were performed and confirmed by me, the nursing staff, and the patient. "Time-out" conducted as per Joint Commission's Universal Protocol (UP.01.01.01). Time: 0950  Description of Procedure:          Laterality: Bilateral. The procedure was performed in identical fashion on both sides. Levels:  L2, L3, L4, L5, & S1 Medial Branch Level(s) Area Prepped: Posterior Lumbosacral Region DuraPrep (Iodine Povacrylex [0.7% available iodine] and Isopropyl Alcohol, 74% w/w) Safety  Precautions: Aspiration looking for blood return was conducted prior to all injections. At no point did we inject any substances, as a needle was being advanced. Before injecting, the patient was told to immediately notify me if he was experiencing any new onset of "ringing in the ears, or metallic taste in the mouth". No attempts were made at seeking any paresthesias. Safe injection practices and needle disposal techniques used. Medications properly checked for expiration dates. SDV (single dose vial) medications used. After the completion of the procedure, all disposable equipment used was discarded in the proper designated medical waste containers. Local Anesthesia: Protocol guidelines were followed. The patient was positioned over the fluoroscopy table. The area was prepped in the usual manner. The time-out was completed. The target area was identified using fluoroscopy. A 12-in long, straight, sterile hemostat was used with fluoroscopic guidance to locate the targets for each level blocked. Once located, the skin was marked with an approved surgical skin marker. Once all sites were marked, the skin (epidermis, dermis, and hypodermis), as well as deeper tissues (fat, connective tissue and muscle) were infiltrated with a small amount of a short-acting local anesthetic, loaded on a 10cc syringe with a 25G, 1.5-in  Needle. An appropriate amount of time was allowed for local anesthetics to take effect before proceeding to the next step. Local Anesthetic: Lidocaine 2.0% The unused portion of the local anesthetic was discarded in the proper designated containers. Technical explanation of process:  L2 Medial Branch Nerve Block (MBB): The target area for the L2 medial branch is at the junction  of the postero-lateral aspect of the superior articular process and the superior, posterior, and medial edge of the transverse process of L3. Under fluoroscopic guidance, a Quincke needle was inserted until contact was made  with os over the superior postero-lateral aspect of the pedicular shadow (target area). After negative aspiration for blood, 0.5 mL of the nerve block solution was injected without difficulty or complication. The needle was removed intact. L3 Medial Branch Nerve Block (MBB): The target area for the L3 medial branch is at the junction of the postero-lateral aspect of the superior articular process and the superior, posterior, and medial edge of the transverse process of L4. Under fluoroscopic guidance, a Quincke needle was inserted until contact was made with os over the superior postero-lateral aspect of the pedicular shadow (target area). After negative aspiration for blood, 0.5 mL of the nerve block solution was injected without difficulty or complication. The needle was removed intact. L4 Medial Branch Nerve Block (MBB): The target area for the L4 medial branch is at the junction of the postero-lateral aspect of the superior articular process and the superior, posterior, and medial edge of the transverse process of L5. Under fluoroscopic guidance, a Quincke needle was inserted until contact was made with os over the superior postero-lateral aspect of the pedicular shadow (target area). After negative aspiration for blood, 0.5 mL of the nerve block solution was injected without difficulty or complication. The needle was removed intact. L5 Medial Branch Nerve Block (MBB): The target area for the L5 medial branch is at the junction of the postero-lateral aspect of the superior articular process and the superior, posterior, and medial edge of the sacral ala. Under fluoroscopic guidance, a Quincke needle was inserted until contact was made with os over the superior postero-lateral aspect of the pedicular shadow (target area). After negative aspiration for blood, 0.5 mL of the nerve block solution was injected without difficulty or complication. The needle was removed intact. S1 Medial Branch Nerve Block (MBB): The  target area for the S1 medial branch is at the posterior and inferior 6 o'clock position of the L5-S1 facet joint. Under fluoroscopic guidance, the Quincke needle inserted for the L5 MBB was redirected until contact was made with os over the inferior and postero aspect of the sacrum, at the 6 o' clock position under the L5-S1 facet joint (Target area). After negative aspiration for blood, 0.5 mL of the nerve block solution was injected without difficulty or complication. The needle was removed intact.  Nerve block solution: 0.2% PF-Ropivacaine + Triamcinolone (40 mg/mL) diluted to a final concentration of 4 mg of Triamcinolone/mL of Ropivacaine The unused portion of the solution was discarded in the proper designated containers. Procedural Needles: 22-gauge, 3.5-inch, Quincke needles used for all levels.  Once the entire procedure was completed, the treated area was cleaned, making sure to leave some of the prepping solution back to take advantage of its long term bactericidal properties.   Illustration of the posterior view of the lumbar spine and the posterior neural structures. Laminae of L2 through S1 are labeled. DPRL5, dorsal primary ramus of L5; DPRS1, dorsal primary ramus of S1; DPR3, dorsal primary ramus of L3; FJ, facet (zygapophyseal) joint L3-L4; I, inferior articular process of L4; LB1, lateral branch of dorsal primary ramus of L1; IAB, inferior articular branches from L3 medial branch (supplies L4-L5 facet joint); IBP, intermediate branch plexus; MB3, medial branch of dorsal primary ramus of L3; NR3, third lumbar nerve root; S, superior articular process of L5; SAB,  superior articular branches from L4 (supplies L4-5 facet joint also); TP3, transverse process of L3.  Vitals:   05/02/20 0852 05/02/20 0950 05/02/20 1000 05/02/20 1005  BP: (!) 154/70 (!) 162/80 (!) 176/77 (!) 172/74  Pulse: (!) 56 (!) 52 (!) 53 (!) 51  Resp: 18 15 13 13   Temp: (!) 97.2 F (36.2 C)     TempSrc: Temporal       SpO2: 99% 98% 99% 99%  Weight: 170 lb (77.1 kg)     Height: 5\' 10"  (1.778 m)        Start Time: 0950 hrs. End Time: 1001 hrs.  Imaging Guidance (Spinal):          Type of Imaging Technique: Fluoroscopy Guidance (Spinal) Indication(s): Assistance in needle guidance and placement for procedures requiring needle placement in or near specific anatomical locations not easily accessible without such assistance. Exposure Time: Please see nurses notes. Contrast: None used. Fluoroscopic Guidance: I was personally present during the use of fluoroscopy. "Tunnel Vision Technique" used to obtain the best possible view of the target area. Parallax error corrected before commencing the procedure. "Direction-depth-direction" technique used to introduce the needle under continuous pulsed fluoroscopy. Once target was reached, antero-posterior, oblique, and lateral fluoroscopic projection used confirm needle placement in all planes. Images permanently stored in EMR. Interpretation: No contrast injected. I personally interpreted the imaging intraoperatively. Adequate needle placement confirmed in multiple planes. Permanent images saved into the patient's record.  Antibiotic Prophylaxis:   Anti-infectives (From admission, onward)   None     Indication(s): None identified  Post-operative Assessment:  Post-procedure Vital Signs:  Pulse/HCG Rate: (!) 51  Temp: (!) 97.2 F (36.2 C) Resp: 13 BP: (!) 172/74 SpO2: 99 %  EBL: None  Complications: No immediate post-treatment complications observed by team, or reported by patient.  Note: The patient tolerated the entire procedure well. A repeat set of vitals were taken after the procedure and the patient was kept under observation following institutional policy, for this type of procedure. Post-procedural neurological assessment was performed, showing return to baseline, prior to discharge. The patient was provided with post-procedure discharge instructions,  including a section on how to identify potential problems. Should any problems arise concerning this procedure, the patient was given instructions to immediately contact us, at any time, without hesitation. In any case, we plan to contact the patient by telephone for a follow-up status report regarding this interventional procedure.  Comments:  No additional relevant information.  Plan of Care  Orders:  Orders Placed This Encounter  Procedures   LUMBAR FACET(MEDIAL BRANCH NERVE BLOCK) MBNB    Scheduling Instructions:     Procedure: Lumbar facet block (AKA.: Lumbosacral medial branch nerve block)     Side: Bilateral     Level: L3-4, L4-5, & L5-S1 Facets (L2, L3, L4, L5, & S1 Medial Branch Nerves)     Sedation: Patient's choice.     Timeframe: Today    Order Specific Question:   Where will this procedure be performed?    Answer:   [facility]   [facility] C-ARM 1-60 MIN NO REPORT    Intraoperative interpretation by procedural physician at [place].    Standing Status:   Standing    Number of Occurrences:   1    Order Specific Question:   Reason for exam:    Answer:   Assistance in needle guidance and placement for procedures requiring needle placement in or near specific anatomical locations not easily accessible  without such assistance.   Informed Consent Details: Physician/Practitioner Attestation; Transcribe to consent form and obtain patient signature    Nursing Order: Transcribe to consent form and obtain patient signature. Note: Always confirm laterality of pain with Mr. Mahar, before procedure.    Order Specific Question:   Physician/Practitioner attestation of informed consent for procedure/surgical case    Answer:   I, the physician/practitioner, attest that I have discussed with the patient the benefits, risks, side effects, alternatives, likelihood of achieving goals and potential problems during recovery for the procedure that I have provided  informed consent.    Order Specific Question:   Procedure    Answer:   Lumbar Facet Block  under fluoroscopic guidance    Order Specific Question:   Physician/Practitioner performing the procedure    Answer:   Gali Spinney A. Dossie Arbour MD    Order Specific Question:   Indication/Reason    Answer:   Low Back Pain, with our without leg pain, due to Facet Joint Arthralgia (Joint Pain) Spondylosis (Arthritis of the Spine), without myelopathy or radiculopathy (Nerve Damage).   Provide equipment / supplies at bedside    "Block Tray" (Disposable   single use) Needle type: SpinalSpinal Amount/quantity: 4 Size: Medium (5-inch) Gauge: 22G    Standing Status:   Standing    Number of Occurrences:   1    Order Specific Question:   Specify    Answer:   Block Tray   Chronic Opioid Analgesic:  Morphine ER 15 mg daily + hydrocodone/APAP 10/325 1 tablet every 8 hours (45 MME/day)  MME/day: 45 mg/day   Medications ordered for procedure: Meds ordered this encounter  Medications   lidocaine (XYLOCAINE) 2 % (with pres) injection 400 mg   DISCONTD: lactated ringers infusion 1,000 mL   DISCONTD: midazolam (VERSED) 5 MG/5ML injection 1-2 mg    Make sure Flumazenil is available in the pyxis when using this medication. If oversedation occurs, administer 0.2 mg IV over 15 sec. If after 45 sec no response, administer 0.2 mg again over 1 min; may repeat at 1 min intervals; not to exceed 4 doses (1 mg)   DISCONTD: fentaNYL (SUBLIMAZE) injection 25-50 mcg    Make sure Narcan is available in the pyxis when using this medication. In the event of respiratory depression (RR< 8/min): Titrate NARCAN (naloxone) in increments of 0.1 to 0.2 mg IV at 2-3 minute intervals, until desired degree of reversal.   ropivacaine (PF) 2 mg/mL (0.2%) (NAROPIN) injection 18 mL   triamcinolone acetonide (KENALOG-40) injection 80 mg   Medications administered: We administered lidocaine, ropivacaine (PF) 2 mg/mL (0.2%), and  triamcinolone acetonide.  See the medical record for exact dosing, route, and time of administration.  Follow-up plan:   Return in about 2 weeks (around 05/16/2020) for (F2F), (PP) Follow-up.       Interventional Pending:   Diagnostic bilateral lumbar facet block #1  (patient decided to have it done without sedation) (today 05/02/2020)   Under consideration:   Diagnostic bilateral lumbar facet block #1  Possible bilateral lumbar facet RFA  Diagnostic bilateral SI joint block #1  Possible bilateral SI joint RFA  Diagnostic left IA hip joint injection    Palliative treatment options:   None at this time    Recent Visits No visits were found meeting these conditions. Showing recent visits within past 90 days and meeting all other requirements Today's Visits Date Type Provider Dept  05/02/20 Procedure visit Milinda Pointer, MD [facility]  Showing today's visits  and meeting all other requirements Future Appointments Date Type Provider Dept  05/20/20 Appointment Milinda Pointer, MD [facility]  Showing future appointments within next 90 days and meeting all other requirements  Disposition: Discharge home  Discharge (Date   Time): 05/02/2020; 1007 hrs.   Primary Care Physician: Albert Ping, MD Location: [facility] Note by: Albert Cola, MD Date: 05/02/2020; Time: 2:59 PM  Disclaimer:  Medicine is not an Chief Strategy Officer. The only guarantee in medicine is that nothing is guaranteed. It is important to note that the decision to proceed with this intervention was based on the information collected from the patient. The Data and conclusions were drawn from the patient's questionnaire, the interview, and the physical examination. Because the information was provided in large part by the patient, it cannot be guaranteed that it has not been purposely or unconsciously manipulated. Every effort has been made to obtain as  much relevant data as possible for this evaluation. It is important to note that the conclusions that lead to this procedure are derived in large part from the available data. Always take into account that the treatment will also be dependent on availability of resources and existing treatment guidelines, considered by other Pain Management Practitioners as being common knowledge and practice, at the time of the intervention. For Medico-Legal purposes, it is also important to point out that variation in procedural techniques and pharmacological choices are the acceptable norm. The indications, contraindications, technique, and results of the above procedure should only be interpreted and judged by a Board-Certified Interventional Pain Specialist with extensive familiarity and expertise in the same exact procedure and technique.

## 2020-05-02 ENCOUNTER — Other Ambulatory Visit: Payer: Self-pay

## 2020-05-02 ENCOUNTER — Ambulatory Visit (HOSPITAL_BASED_OUTPATIENT_CLINIC_OR_DEPARTMENT_OTHER): Payer: No Typology Code available for payment source | Admitting: Pain Medicine

## 2020-05-02 ENCOUNTER — Ambulatory Visit
Admission: RE | Admit: 2020-05-02 | Discharge: 2020-05-02 | Disposition: A | Discharge status: Home / Self Care | Payer: No Typology Code available for payment source | Source: Ambulatory Visit | Attending: Pain Medicine | Admitting: Pain Medicine

## 2020-05-02 VITALS — BP 172/74 | HR 51 | Temp 97.2°F | Resp 13 | Ht 70.0 in | Wt 170.0 lb

## 2020-05-02 DIAGNOSIS — G8929 Other chronic pain: Secondary | ICD-10-CM | POA: Insufficient documentation

## 2020-05-02 DIAGNOSIS — M47817 Spondylosis without myelopathy or radiculopathy, lumbosacral region: Secondary | ICD-10-CM | POA: Diagnosis present

## 2020-05-02 DIAGNOSIS — M47816 Spondylosis without myelopathy or radiculopathy, lumbar region: Secondary | ICD-10-CM

## 2020-05-02 DIAGNOSIS — M5386 Other specified dorsopathies, lumbar region: Secondary | ICD-10-CM | POA: Insufficient documentation

## 2020-05-02 DIAGNOSIS — M4316 Spondylolisthesis, lumbar region: Secondary | ICD-10-CM | POA: Insufficient documentation

## 2020-05-02 DIAGNOSIS — M545 Low back pain, unspecified: Secondary | ICD-10-CM | POA: Insufficient documentation

## 2020-05-02 DIAGNOSIS — M5136 Other intervertebral disc degeneration, lumbar region: Secondary | ICD-10-CM | POA: Diagnosis present

## 2020-05-02 MED ORDER — TRIAMCINOLONE ACETONIDE 40 MG/ML IJ SUSP
80.0000 mg | Freq: Once | INTRAMUSCULAR | Status: AC
  Administered 2020-05-02: 80 mg
  Filled 2020-05-02: qty 2

## 2020-05-02 MED ORDER — ROPIVACAINE HCL 2 MG/ML IJ SOLN
18.0000 mL | Freq: Once | INTRAMUSCULAR | Status: AC
  Administered 2020-05-02: 18 mL via PERINEURAL
  Filled 2020-05-02: qty 20

## 2020-05-02 MED ORDER — MIDAZOLAM HCL 5 MG/5ML IJ SOLN: 1.0000 mg | INTRAMUSCULAR | Status: DC | PRN

## 2020-05-02 MED ORDER — LACTATED RINGERS IV SOLN: 1000.0000 mL | Freq: Once | INTRAVENOUS | Status: DC

## 2020-05-02 MED ORDER — FENTANYL CITRATE (PF) 100 MCG/2ML IJ SOLN: 25.0000 ug | INTRAMUSCULAR | Status: DC | PRN

## 2020-05-02 MED ORDER — LIDOCAINE HCL 2 % IJ SOLN
20.0000 mL | Freq: Once | INTRAMUSCULAR | Status: AC
  Administered 2020-05-02: 400 mg
  Filled 2020-05-02: qty 10

## 2020-05-02 NOTE — Patient Instructions (Signed)

## 2020-05-03 ENCOUNTER — Telehealth: Payer: Self-pay

## 2020-05-03 NOTE — Telephone Encounter (Signed)
PP call, states that after the numbness wore off he started hurting. Told him that was expepted and to give it 3-10 for the steroid to start working. Pt with understanding.

## 2020-05-20 ENCOUNTER — Encounter: Payer: Self-pay | Admitting: Pain Medicine

## 2020-05-20 ENCOUNTER — Other Ambulatory Visit: Payer: Self-pay

## 2020-05-20 ENCOUNTER — Ambulatory Visit: Payer: No Typology Code available for payment source | Attending: Pain Medicine | Admitting: Pain Medicine

## 2020-05-20 VITALS — BP 143/81 | HR 68 | Temp 97.3°F | Ht 70.0 in | Wt 170.0 lb

## 2020-05-20 DIAGNOSIS — G894 Chronic pain syndrome: Secondary | ICD-10-CM | POA: Diagnosis not present

## 2020-05-20 DIAGNOSIS — M545 Low back pain, unspecified: Secondary | ICD-10-CM | POA: Insufficient documentation

## 2020-05-20 DIAGNOSIS — G8929 Other chronic pain: Secondary | ICD-10-CM | POA: Insufficient documentation

## 2020-05-20 DIAGNOSIS — M47816 Spondylosis without myelopathy or radiculopathy, lumbar region: Secondary | ICD-10-CM | POA: Insufficient documentation

## 2020-05-20 DIAGNOSIS — M5386 Other specified dorsopathies, lumbar region: Secondary | ICD-10-CM | POA: Insufficient documentation

## 2020-05-20 NOTE — Progress Notes (Signed)
Safety precautions to be maintained throughout the outpatient stay will include: orient to surroundings, keep bed in low position, maintain call bell within reach at all times, provide assistance with transfer out of bed and ambulation.  

## 2020-05-20 NOTE — Patient Instructions (Signed)
____________________________________________________________________________________________  Preparing for Procedure with Sedation  Procedure appointments are limited to planned procedures: . No Prescription Refills. . No disability issues will be discussed. . No medication changes will be discussed.  Instructions: . Oral Intake: Do not eat or drink anything for at least 8 hours prior to your procedure. (Exception: Blood Pressure Medication. See below.) . Transportation: Unless otherwise stated by your physician, you may drive yourself after the procedure. . Blood Pressure Medicine: Do not forget to take your blood pressure medicine with a sip of water the morning of the procedure. If your Diastolic (lower reading)is above 100 mmHg, elective cases will be cancelled/rescheduled. . Blood thinners: These will need to be stopped for procedures. Notify our staff if you are taking any blood thinners. Depending on which one you take, there will be specific instructions on how and when to stop it. . Diabetics on insulin: Notify the staff so that you can be scheduled 1st case in the morning. If your diabetes requires high dose insulin, take only  of your normal insulin dose the morning of the procedure and notify the staff that you have done so. . Preventing infections: Shower with an antibacterial soap the morning of your procedure. . Build-up your immune system: Take 1000 mg of Vitamin C with every meal (3 times a day) the day prior to your procedure. . Antibiotics: Inform the staff if you have a condition or reason that requires you to take antibiotics before dental procedures. . Pregnancy: If you are pregnant, call and cancel the procedure. . Sickness: If you have a cold, fever, or any active infections, call and cancel the procedure. . Arrival: You must be in the facility at least 30 minutes prior to your scheduled procedure. . Children: Do not bring children with you. . Dress appropriately:  Bring dark clothing that you would not mind if they get stained. . Valuables: Do not bring any jewelry or valuables.  Reasons to call and reschedule or cancel your procedure: (Following these recommendations will minimize the risk of a serious complication.) . Surgeries: Avoid having procedures within 2 weeks of any surgery. (Avoid for 2 weeks before or after any surgery). . Flu Shots: Avoid having procedures within 2 weeks of a flu shots or . (Avoid for 2 weeks before or after immunizations). . Barium: Avoid having a procedure within 7-10 days after having had a radiological study involving the use of radiological contrast. (Myelograms, Barium swallow or enema study). . Heart attacks: Avoid any elective procedures or surgeries for the initial 6 months after a "Myocardial Infarction" (Heart Attack). . Blood thinners: It is imperative that you stop these medications before procedures. Let us know if you if you take any blood thinner.  . Infection: Avoid procedures during or within two weeks of an infection (including chest colds or gastrointestinal problems). Symptoms associated with infections include: Localized redness, fever, chills, night sweats or profuse sweating, burning sensation when voiding, cough, congestion, stuffiness, runny nose, sore throat, diarrhea, nausea, vomiting, cold or Flu symptoms, recent or current infections. It is specially important if the infection is over the area that we intend to treat. . Heart and lung problems: Symptoms that may suggest an active cardiopulmonary problem include: cough, chest pain, breathing difficulties or shortness of breath, dizziness, ankle swelling, uncontrolled high or unusually low blood pressure, and/or palpitations. If you are experiencing any of these symptoms, cancel your procedure and contact your primary care physician for an evaluation.  Remember:  Regular Business hours are:    Monday to Thursday 8:00 AM to 4:00 PM  Provider's  Schedule: Alante Weimann, MD:  Procedure days: Tuesday and Thursday 7:30 AM to 4:00 PM  Bilal Lateef, MD:  Procedure days: Monday and Wednesday 7:30 AM to 4:00 PM ____________________________________________________________________________________________    

## 2020-05-20 NOTE — Progress Notes (Signed)
PROVIDER NOTE: Information contained herein reflects review and annotations entered in association with encounter. Interpretation of such information and data should be left to medically-trained personnel. Information provided to patient can be located elsewhere in the medical record under "Patient Instructions". Document created using STT-dictation technology, any transcriptional errors that may result from process are unintentional.    Patient: Tonia Brooms  Service Category: E/M  Provider: Gaspar Cola, MD  DOB: 10-20-1945  DOS: 05/20/2020  Specialty: Interventional Pain Management  MRN: [id number]  Setting: Ambulatory outpatient  PCP: Ferdie Ping, MD  Type: Established Patient    Referring Provider: Ferdie Ping, MD  Location: Office  Delivery: Face-to-face     HPI  Mr. MANOAH DECKARD, a 74 y.o. year old male, is here today because of his Chronic pain syndrome [G89.4]. Mr. Keimig primary complain today is Back Pain Last encounter: My last encounter with him was on 05/02/2020. Pertinent problems: Mr. Honea has Chronic pain syndrome; Chronic low back pain (1ry area of Pain) (Bilateral) (L>R) w/o sciatica; Lumbar facet syndrome (Bilateral) (L>R); Chronic lower extremity pain (Bilateral) (L>R); Chronic hip pain (Left); Chronic sacroiliac joint pain (Bilateral) (L>R); Somatic dysfunction of sacroiliac joints (Bilateral); Spondylosis without myelopathy or radiculopathy, lumbosacral region; Lumbar facet arthropathy (Multilevel) (Bilateral); Lumbar facet hypertrophy (Multilevel) (Bilateral); DDD (degenerative disc disease), lumbar; Degenerative joint disease (DJD) of lumbar spine; Osteoarthritis of facet joint of lumbar spine; Osteoarthritis of lumbar spine; 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 Pain Assessment: Severity of Chronic pain is reported as a 6 /10. Location: Back Lower/Pain radiaties down  to the back of his hip and thigh. Onset: More than a month ago. Quality: Numbness, Jabbing, Aching, Discomfort, Restless. Timing: Intermittent. Modifying factor(s): meds. Vitals:  height is _0  (1.778 m) and weight is 170 lb (77.1 kg). His temperature is 97.3 F (36.3 C) (abnormal). His blood pressure is 143/81 (abnormal) and his pulse is 68. His oxygen saturation is 99%.   Reason for encounter: post-procedure assessment.  The patient indicates having attained 100% relief of the pain for the duration of local anesthetic, bilaterally.  Today he describes his pain as being a little bit more towards the center of the back.  Provocative physical exam today was negative for sacroiliac joint pain on Patrick maneuver and FABER maneuver, bilaterally.  However he did experience some discomfort in the hip region during the same maneuver but he indicated that it did not reproduce the pain that he has been experiencing.  Hyperextension on rotation of the lumbar spine did reproduce his low back pain again.  This in communication with the results of the diagnostic bilateral lumbar facet block would suggest the lumbar facets to be the etiology of his current problems.  He indicates that his medications continue to provide him with relief of the pain although they do not seem to be as effective as they were when he started taking them.  After talking about this for a while, we have come to the conclusion that he is experiencing some tolerance to those opioids.  I have recommended a "Drug Holiday".  He indicates that he is not sure that he can do that with the current pain that he is experiencing and that why I recommended doing the second diagnostic injection and if again we get similar results, then we will move onto radiofrequency ablation.  He understood and accepted.  Post-Procedure Evaluation  Procedure (05/02/2020):  Diagnostic bilateral lumbar facet block #1 under fluoroscopic guidance, no sedation. Pre-procedure  pain level: 8/10 Post-procedure: 2/10 Limited initial benefit, possibly due to rapid discharge after no sedation procedure, without enough time to allow full onset of block.  Sedation: None.  Effectiveness during initial hour after procedure(Ultra-Short Term Relief): 100 %.  Local anesthetic used: Long-acting (4-6 hours) Effectiveness: Defined as any analgesic benefit obtained secondary to the administration of local anesthetics. This carries significant diagnostic value as to the etiological location, or anatomical origin, of the pain. Duration of benefit is expected to coincide with the duration of the local anesthetic used.  Effectiveness during initial 4-6 hours after procedure(Short-Term Relief): 100 %.  Long-term benefit: Defined as any relief past the pharmacologic duration of the local anesthetics.  Effectiveness past the initial 6 hours after procedure(Long-Term Relief): 0 % (after numbness wear off, pain was back to where it was).  Current benefits: Defined as benefit that persist at this time.   Analgesia:  Back to baseline Function: Back to baseline ROM: Back to baseline  Pharmacotherapy Assessment   Analgesic: Morphine ER 15 mg daily (15 MME) + hydrocodone/APAP 10/325 1 tablet every 8 hours (45 MME/day) written by the "[facility]" MME/day: 60 mg/day   Monitoring: [place] PMP: PDMP reviewed during this encounter.       Pharmacotherapy: No side-effects or adverse reactions reported. Compliance: No problems identified. Effectiveness: Clinically acceptable.  Chauncey Fischer, RN  05/20/2020  8:29 AM  Sign when Signing Visit Safety precautions to be maintained throughout the outpatient stay will include: orient to surroundings, keep bed in low position, maintain call bell within reach at all times, provide assistance with transfer out of bed and ambulation.     UDS:  Summary  Date Value Ref Range Status  01/29/2020 Note  Final    Comment:     ==================================================================== Compliance Drug Analysis, Ur ==================================================================== Test                             Result       Flag       Units  Drug Present and Declared for Prescription Verification   Morphine                       3126         EXPECTED   ng/mg creat   Normorphine                    100          EXPECTED   ng/mg creat    Potential sources of morphine include administration of codeine or    morphine, use of heroin, or ingestion of poppy seeds.     Normorphine is an expected metabolite of morphine.    Hydrocodone                    187          EXPECTED   ng/mg creat   Dihydrocodeine                 169          EXPECTED   ng/mg creat   Norhydrocodone                 3061         EXPECTED   ng/mg creat    Sources of hydrocodone include  scheduled prescription medications.    Dihydrocodeine and norhydrocodone are expected metabolites of    hydrocodone. Dihydrocodeine is also available as a scheduled    prescription medication.    Acetaminophen                  PRESENT      EXPECTED ==================================================================== Test                      Result    Flag   Units      Ref Range   Creatinine              61               mg/dL      >=20 ==================================================================== Declared Medications:  The flagging and interpretation on this report are based on the  following declared medications.  Unexpected results may arise from  inaccuracies in the declared medications.   **Note: The testing scope of this panel includes these medications:   Hydrocodone (Norco)  Morphine (MSIR)   **Note: The testing scope of this panel does not include small to  moderate amounts of these reported medications:   Acetaminophen (Norco)   **Note: The testing scope of this panel does not include the  following reported medications:    Atorvastatin (Lipitor)  Hydrochlorothiazide (Maxzide)  Tamsulosin (Flomax)  Terazosin (Hytrin)  Triamterene (Maxzide) ==================================================================== For clinical consultation, please call [phone]. ====================================================================      ROS  Constitutional: Denies any fever or chills Gastrointestinal: No reported hemesis, hematochezia, vomiting, or acute GI distress Musculoskeletal: Denies any acute onset joint swelling, redness, loss of ROM, or weakness Neurological: No reported episodes of acute onset apraxia, aphasia, dysarthria, agnosia, amnesia, paralysis, loss of coordination, or loss of consciousness  Medication Review  HYDROcodone-acetaminophen, atorvastatin, morphine, tamsulosin, terazosin, and triamterene-hydrochlorothiazide  History Review  Allergy: Mr. Scouten has No Known Allergies. Drug: Mr. Aversa  reports previous drug use. Alcohol:  reports current alcohol use. Tobacco:  reports that he has quit smoking. He has quit using smokeless tobacco. Social: Mr. Mustard  reports that he has quit smoking. He has quit using smokeless tobacco. He reports current alcohol use. He reports previous drug use. Medical:  has a past medical history of Hypertension. Surgical: Mr. Kraai  has a past surgical history that includes Hernia repair. Family: family history is not on file.  Laboratory Chemistry Profile   Renal Lab Results  Component Value Date   BUN 15 01/29/2020   CREATININE 0.93 01/29/2020   BCR 16 01/29/2020   GFRAA 93 01/29/2020   GFRNONAA 80 01/29/2020     Hepatic Lab Results  Component Value Date   AST 18 01/29/2020   ALBUMIN 4.4 01/29/2020   ALKPHOS 50 01/29/2020     Electrolytes Lab Results  Component Value Date   NA 142 01/29/2020   K 4.8 01/29/2020   CL 102 01/29/2020   CALCIUM 9.4 01/29/2020   MG 2.3 01/29/2020     Bone Lab Results  Component Value Date   25OHVITD1 54  01/29/2020   25OHVITD2 <1.0 01/29/2020   25OHVITD3 54 01/29/2020     Inflammation (CRP: Acute Phase) (ESR: Chronic Phase) Lab Results  Component Value Date   CRP <1 01/29/2020   ESRSEDRATE 2 01/29/2020       Note: Above Lab results reviewed.  Recent Imaging Review  DG PAIN CLINIC C-ARM 1-60 MIN NO REPORT Fluoro was used, but no Radiologist interpretation will be provided.  Please refer to "NOTES" tab for provider progress note. Note: Reviewed        Physical Exam  General appearance: Well nourished, well developed, and well hydrated. In no apparent acute distress Mental status: Alert, oriented x 3 (person, place, & time)       Respiratory: No evidence of acute respiratory distress Eyes: PERLA Vitals: BP (!) 143/81   Pulse 68   Temp (!) 97.3 F (36.3 C)   Ht _0  (1.778 m)   Wt 170 lb (77.1 kg)   SpO2 99%   BMI 24.39 kg/m  BMI: Estimated body mass index is 24.39 kg/m as calculated from the following:   Height as of this encounter: _1  (1.778 m).   Weight as of this encounter: 170 lb (77.1 kg). Ideal: Ideal body weight: 73 kg (160 lb 15 oz) Adjusted ideal body weight: 74.6 kg (164 lb 9 oz)  Assessment   Status Diagnosis  Controlled Controlled Controlled 1. Chronic pain syndrome   2. Lumbar facet syndrome (Bilateral) (L>R)   3. Chronic low back pain (1ry area of Pain) (Bilateral) (L>R) w/o sciatica   4. Decreased range of motion of lumbar spine      Updated Problems: No problems updated.  Plan of Care  Problem-specific:  No problem-specific Assessment & Plan notes found for this encounter.  Mr. KASEN ADDUCI has a current medication list which includes the following long-term medication(s): atorvastatin, terazosin, and triamterene-hydrochlorothiazide.  Pharmacotherapy (Medications Ordered): No orders of the defined types were placed in this encounter.  Orders:  Orders Placed This Encounter  Procedures  . LUMBAR FACET(MEDIAL BRANCH NERVE BLOCK)  MBNB    Standing Status:   Future    Standing Expiration Date:   06/19/2020    Scheduling Instructions:     Procedure: Lumbar facet block (AKA.: Lumbosacral medial branch nerve block)     Side: Bilateral     Level: L3-4, L4-5, & L5-S1 Facets (L2, L3, L4, L5, & S1 Medial Branch Nerves)     Sedation: Patient's choice.     Timeframe: ASAA    Order Specific Question:   Where will this procedure be performed?    Answer:   [facility]   Follow-up plan:   Return for Procedure (w/ sedation): (B) L-FCT BLK #1.      Interventional Pending:   Diagnostic bilateral lumbar facet block #1  (patient decided to have it done without sedation) (today 05/02/2020)   Under consideration:   Diagnostic bilateral lumbar facet block #1  Possible bilateral lumbar facet RFA  Diagnostic bilateral SI joint block #1  Possible bilateral SI joint RFA  Diagnostic left IA hip joint injection    Palliative treatment options:   None at this time     Recent Visits Date Type Provider Dept  05/02/20 Procedure visit Milinda Pointer, MD [facility]  Showing recent visits within past 90 days and meeting all other requirements Today's Visits Date Type Provider Dept  05/20/20 Office Visit Milinda Pointer, MD [facility]  Showing today's visits and meeting all other requirements Future Appointments No visits were found meeting these conditions. Showing future appointments within next 90 days and meeting all other requirements  I discussed the assessment and treatment plan with the patient. The patient was provided an opportunity to ask questions and all were answered. The patient agreed with the plan and demonstrated an understanding of the instructions.  Patient advised to call back or seek an in-person evaluation if the symptoms  or condition worsens.  Duration of encounter: 30 minutes.  Note by: Gaspar Cola, MD Date: 05/20/2020; Time: 8:58 AM

## 2020-05-22 NOTE — Patient Instructions (Signed)

## 2020-05-22 NOTE — Progress Notes (Signed)
PROVIDER NOTE: Information contained herein reflects review and annotations entered in association with encounter. Interpretation of such information and data should be left to medically-trained personnel. Information provided to patient can be located elsewhere in the medical record under "Patient Instructions". Document created using STT-dictation technology, any transcriptional errors that may result from process are unintentional.    Patient: Albert Gordon  Service Category: Procedure  Provider: Gaspar Cola, MD  DOB: [birth year]  DOS: 05/23/2020  Location: [facility]  MRN: [id number]  Setting: Ambulatory - outpatient  Referring Provider: Ferdie Ping, MD  Type: Established Patient  Specialty: Interventional Pain Management  PCP: Ferdie Ping, MD   Primary Reason for Visit: Interventional Pain Management Treatment. CC: Back Pain (lumbar bilateral )  Procedure:          Anesthesia, Analgesia, Anxiolysis:  Type: Lumbar Facet, Medial Branch Block(s) #2  Primary Purpose: Diagnostic Region: Posterolateral Lumbosacral Spine Level: L2, L3, L4, L5, & S1 Medial Branch Level(s). Injecting these levels blocks the L3-4, L4-5, and L5-S1 lumbar facet joints. Laterality: Bilateral  Type: Moderate (Conscious) Sedation combined with Local Anesthesia Indication(s): Analgesia and Anxiety Route: Intravenous (IV) IV Access: Secured Sedation: Meaningful verbal contact was maintained at all times during the procedure  Local Anesthetic: Lidocaine 1-2%  Position: Prone   Indications: 1. Lumbar facet syndrome (Bilateral) (L>R)   2. Spondylosis without myelopathy or radiculopathy, lumbosacral region   3. Lumbar facet hypertrophy (Multilevel) (Bilateral)   4. Lumbar facet arthropathy (Multilevel) (Bilateral)   5. DDD (degenerative disc disease), lumbar   6. Chronic low back pain (1ry area of Pain) (Bilateral) (L>R) w/o sciatica    Pain Score: Pre-procedure: 5  /10 Post-procedure: 0-No pain/10   Pre-op Assessment:  Mr. Birkeland is a 74 y.o. (year old), male patient, seen today for interventional treatment. He  has a past surgical history that includes Hernia repair. Mr. Kittel has a current medication list which includes the following prescription(s): atorvastatin, hydrocodone-acetaminophen, morphine, tamsulosin, terazosin, and triamterene-hydrochlorothiazide, and the following Facility-Administered Medications: fentanyl and midazolam. His primarily concern today is the Back Pain (lumbar bilateral )  Initial Vital Signs:  Pulse/HCG Rate: 72ECG Heart Rate: 62 Temp: (!) 97.1 F (36.2 C) Resp: 16 BP: (!) 148/86 SpO2: 99 %  BMI: Estimated body mass index is 24.39 kg/m as calculated from the following:   Height as of this encounter: 5\' 10"  (1.778 m).   Weight as of this encounter: 170 lb (77.1 kg).  Risk Assessment: Allergies: Reviewed. He has No Known Allergies.  Allergy Precautions: None required Coagulopathies: Reviewed. None identified.  Blood-thinner therapy: None at this time Active Infection(s): Reviewed. None identified. Mr. Bubolz is afebrile  Site Confirmation: Mr. Heintzelman was asked to confirm the procedure and laterality before marking the site Procedure checklist: Completed Consent: Before the procedure and under the influence of no sedative(s), amnesic(s), or anxiolytics, the patient was informed of the treatment options, risks and possible complications. To fulfill our ethical and legal obligations, as recommended by the American Medical Association's Code of Ethics, I have informed the patient of my clinical impression; the nature and purpose of the treatment or procedure; the risks, benefits, and possible complications of the intervention; the alternatives, including doing nothing; the risk(s) and benefit(s) of the alternative treatment(s) or procedure(s); and the risk(s) and benefit(s) of doing nothing. The patient was provided information  about the general risks and possible complications associated with the procedure. These may include, but are not limited to: failure to achieve  desired goals, infection, bleeding, organ or nerve damage, allergic reactions, paralysis, and death. In addition, the patient was informed of those risks and complications associated to Spine-related procedures, such as failure to decrease pain; infection (i.e.: Meningitis, epidural or intraspinal abscess); bleeding (i.e.: epidural hematoma, subarachnoid hemorrhage, or any other type of intraspinal or peri-dural bleeding); organ or nerve damage (i.e.: Any type of peripheral nerve, nerve root, or spinal cord injury) with subsequent damage to sensory, motor, and/or autonomic systems, resulting in permanent pain, numbness, and/or weakness of one or several areas of the body; allergic reactions; (i.e.: anaphylactic reaction); and/or death. Furthermore, the patient was informed of those risks and complications associated with the medications. These include, but are not limited to: allergic reactions (i.e.: anaphylactic or anaphylactoid reaction(s)); adrenal axis suppression; blood sugar elevation that in diabetics may result in ketoacidosis or comma; water retention that in patients with history of congestive heart failure may result in shortness of breath, pulmonary edema, and decompensation with resultant heart failure; weight gain; swelling or edema; medication-induced neural toxicity; particulate matter embolism and blood vessel occlusion with resultant organ, and/or nervous system infarction; and/or aseptic necrosis of one or more joints. Finally, the patient was informed that Medicine is not an exact science; therefore, there is also the possibility of unforeseen or unpredictable risks and/or possible complications that may result in a catastrophic outcome. The patient indicated having understood very clearly. We have given the patient no guarantees and we have made no  promises. Enough time was given to the patient to ask questions, all of which were answered to the patient's satisfaction. Mr. Lupton has indicated that he wanted to continue with the procedure. Attestation: I, the ordering provider, attest that I have discussed with the patient the benefits, risks, side-effects, alternatives, likelihood of achieving goals, and potential problems during recovery for the procedure that I have provided informed consent. Date  Time: 05/23/2020 11:24 AM  Pre-Procedure Preparation:  Monitoring: As per clinic protocol. Respiration, ETCO2, SpO2, BP, heart rate and rhythm monitor placed and checked for adequate function Safety Precautions: Patient was assessed for positional comfort and pressure points before starting the procedure. Time-out: I initiated and conducted the "Time-out" before starting the procedure, as per protocol. The patient was asked to participate by confirming the accuracy of the "Time Out" information. Verification of the correct person, site, and procedure were performed and confirmed by me, the nursing staff, and the patient. "Time-out" conducted as per Joint Commission's Universal Protocol (UP.01.01.01). Time: 1156  Description of Procedure:          Laterality: Bilateral. The procedure was performed in identical fashion on both sides. Levels:  L2, L3, L4, L5, & S1 Medial Branch Level(s) Area Prepped: Posterior Lumbosacral Region DuraPrep (Iodine Povacrylex [0.7% available iodine] and Isopropyl Alcohol, 74% w/w) Safety Precautions: Aspiration looking for blood return was conducted prior to all injections. At no point did we inject any substances, as a needle was being advanced. Before injecting, the patient was told to immediately notify me if he was experiencing any new onset of "ringing in the ears, or metallic taste in the mouth". No attempts were made at seeking any paresthesias. Safe injection practices and needle disposal techniques used.  Medications properly checked for expiration dates. SDV (single dose vial) medications used. After the completion of the procedure, all disposable equipment used was discarded in the proper designated medical waste containers. Local Anesthesia: Protocol guidelines were followed. The patient was positioned over the fluoroscopy table. The area was prepped  in the usual manner. The time-out was completed. The target area was identified using fluoroscopy. A 12-in long, straight, sterile hemostat was used with fluoroscopic guidance to locate the targets for each level blocked. Once located, the skin was marked with an approved surgical skin marker. Once all sites were marked, the skin (epidermis, dermis, and hypodermis), as well as deeper tissues (fat, connective tissue and muscle) were infiltrated with a small amount of a short-acting local anesthetic, loaded on a 10cc syringe with a 25G, 1.5-in  Needle. An appropriate amount of time was allowed for local anesthetics to take effect before proceeding to the next step. Local Anesthetic: Lidocaine 2.0% The unused portion of the local anesthetic was discarded in the proper designated containers. Technical explanation of process:  L2 Medial Branch Nerve Block (MBB): The target area for the L2 medial branch is at the junction of the postero-lateral aspect of the superior articular process and the superior, posterior, and medial edge of the transverse process of L3. Under fluoroscopic guidance, a Quincke needle was inserted until contact was made with os over the superior postero-lateral aspect of the pedicular shadow (target area). After negative aspiration for blood, 0.5 mL of the nerve block solution was injected without difficulty or complication. The needle was removed intact. L3 Medial Branch Nerve Block (MBB): The target area for the L3 medial branch is at the junction of the postero-lateral aspect of the superior articular process and the superior, posterior, and  medial edge of the transverse process of L4. Under fluoroscopic guidance, a Quincke needle was inserted until contact was made with os over the superior postero-lateral aspect of the pedicular shadow (target area). After negative aspiration for blood, 0.5 mL of the nerve block solution was injected without difficulty or complication. The needle was removed intact. L4 Medial Branch Nerve Block (MBB): The target area for the L4 medial branch is at the junction of the postero-lateral aspect of the superior articular process and the superior, posterior, and medial edge of the transverse process of L5. Under fluoroscopic guidance, a Quincke needle was inserted until contact was made with os over the superior postero-lateral aspect of the pedicular shadow (target area). After negative aspiration for blood, 0.5 mL of the nerve block solution was injected without difficulty or complication. The needle was removed intact. L5 Medial Branch Nerve Block (MBB): The target area for the L5 medial branch is at the junction of the postero-lateral aspect of the superior articular process and the superior, posterior, and medial edge of the sacral ala. Under fluoroscopic guidance, a Quincke needle was inserted until contact was made with os over the superior postero-lateral aspect of the pedicular shadow (target area). After negative aspiration for blood, 0.5 mL of the nerve block solution was injected without difficulty or complication. The needle was removed intact. S1 Medial Branch Nerve Block (MBB): The target area for the S1 medial branch is at the posterior and inferior 6 o'clock position of the L5-S1 facet joint. Under fluoroscopic guidance, the Quincke needle inserted for the L5 MBB was redirected until contact was made with os over the inferior and postero aspect of the sacrum, at the 6 o' clock position under the L5-S1 facet joint (Target area). After negative aspiration for blood, 0.5 mL of the nerve block solution was  injected without difficulty or complication. The needle was removed intact.  Nerve block solution: 0.2% PF-Ropivacaine + Triamcinolone (40 mg/mL) diluted to a final concentration of 4 mg of Triamcinolone/mL of Ropivacaine The unused portion  of the solution was discarded in the proper designated containers. Procedural Needles: 22-gauge, 3.5-inch, Quincke needles used for all levels.  Once the entire procedure was completed, the treated area was cleaned, making sure to leave some of the prepping solution back to take advantage of its long term bactericidal properties.   Illustration of the posterior view of the lumbar spine and the posterior neural structures. Laminae of L2 through S1 are labeled. DPRL5, dorsal primary ramus of L5; DPRS1, dorsal primary ramus of S1; DPR3, dorsal primary ramus of L3; FJ, facet (zygapophyseal) joint L3-L4; I, inferior articular process of L4; LB1, lateral branch of dorsal primary ramus of L1; IAB, inferior articular branches from L3 medial branch (supplies L4-L5 facet joint); IBP, intermediate branch plexus; MB3, medial branch of dorsal primary ramus of L3; NR3, third lumbar nerve root; S, superior articular process of L5; SAB, superior articular branches from L4 (supplies L4-5 facet joint also); TP3, transverse process of L3.  Vitals:   05/23/20 1203 05/23/20 1206 05/23/20 1212 05/23/20 1222  BP: 128/71 135/72 (!) 152/77 (!) 141/70  Pulse:      Resp: 11 12 16 16   Temp:   97.7 F (36.5 C)   TempSrc:      SpO2: 96% 97% 100% 100%  Weight:      Height:         Start Time: 1156 hrs. End Time: 1205 hrs.  Imaging Guidance (Spinal):          Type of Imaging Technique: Fluoroscopy Guidance (Spinal) Indication(s): Assistance in needle guidance and placement for procedures requiring needle placement in or near specific anatomical locations not easily accessible without such assistance. Exposure Time: Please see nurses notes. Contrast: None used. Fluoroscopic  Guidance: I was personally present during the use of fluoroscopy. "Tunnel Vision Technique" used to obtain the best possible view of the target area. Parallax error corrected before commencing the procedure. "Direction-depth-direction" technique used to introduce the needle under continuous pulsed fluoroscopy. Once target was reached, antero-posterior, oblique, and lateral fluoroscopic projection used confirm needle placement in all planes. Images permanently stored in EMR. Interpretation: No contrast injected. I personally interpreted the imaging intraoperatively. Adequate needle placement confirmed in multiple planes. Permanent images saved into the patient's record.  Antibiotic Prophylaxis:   Anti-infectives (From admission, onward)   None     Indication(s): None identified  Post-operative Assessment:  Post-procedure Vital Signs:  Pulse/HCG Rate: 72(!) 58 Temp: 97.7 F (36.5 C) Resp: 16 BP: (!) 141/70 SpO2: 100 %  EBL: None  Complications: No immediate post-treatment complications observed by team, or reported by patient.  Note: The patient tolerated the entire procedure well. A repeat set of vitals were taken after the procedure and the patient was kept under observation following institutional policy, for this type of procedure. Post-procedural neurological assessment was performed, showing return to baseline, prior to discharge. The patient was provided with post-procedure discharge instructions, including a section on how to identify potential problems. Should any problems arise concerning this procedure, the patient was given instructions to immediately contact us, at any time, without hesitation. In any case, we plan to contact the patient by telephone for a follow-up status report regarding this interventional procedure.  Comments:  No additional relevant information.  Plan of Care  Orders:  Orders Placed This Encounter  Procedures  . LUMBAR FACET(MEDIAL BRANCH NERVE BLOCK)  MBNB    Scheduling Instructions:     Procedure: Lumbar facet block (AKA.: Lumbosacral medial branch nerve block)  Side: Bilateral     Level: L3-4, L4-5, & L5-S1 Facets (L2, L3, L4, L5, & S1 Medial Branch Nerves)     Sedation: Patient's choice.     Timeframe: Today    Order Specific Question:   Where will this procedure be performed?    Answer:   [facility]  . DG [facility] C-ARM 1-60 MIN NO REPORT    Intraoperative interpretation by procedural physician at [place].    Standing Status:   Standing    Number of Occurrences:   1    Order Specific Question:   Reason for exam:    Answer:   Assistance in needle guidance and placement for procedures requiring needle placement in or near specific anatomical locations not easily accessible without such assistance.  . Informed Consent Details: Physician/Practitioner Attestation; Transcribe to consent form and obtain patient signature    Nursing Order: Transcribe to consent form and obtain patient signature. Note: Always confirm laterality of pain with Mr. Mccarrick, before procedure.    Order Specific Question:   Physician/Practitioner attestation of informed consent for procedure/surgical case    Answer:   I, the physician/practitioner, attest that I have discussed with the patient the benefits, risks, side effects, alternatives, likelihood of achieving goals and potential problems during recovery for the procedure that I have provided informed consent.    Order Specific Question:   Procedure    Answer:   Lumbar Facet Block  under fluoroscopic guidance    Order Specific Question:   Physician/Practitioner performing the procedure    Answer:   Halie Gass A. Dossie Arbour MD    Order Specific Question:   Indication/Reason    Answer:   Low Back Pain, with our without leg pain, due to Facet Joint Arthralgia (Joint Pain) Spondylosis (Arthritis of the Spine), without myelopathy or radiculopathy (Nerve Damage).  . Provide equipment /  supplies at bedside    "Block Tray" (Disposable  single use) Needle type: SpinalSpinal Amount/quantity: 4 Size: Medium (5-inch) Gauge: 22G    Standing Status:   Standing    Number of Occurrences:   1    Order Specific Question:   Specify    Answer:   Block Tray   Chronic Opioid Analgesic:  Morphine ER 15 mg daily (15 MME) + hydrocodone/APAP 10/325 1 tablet every 8 hours (45 MME/day) written by the "[place]" MME/day: 60 mg/day   Medications ordered for procedure: Meds ordered this encounter  Medications  . lidocaine (XYLOCAINE) 2 % (with pres) injection 400 mg  . lactated ringers infusion 1,000 mL  . midazolam (VERSED) 5 MG/5ML injection 1-2 mg    Make sure Flumazenil is available in the pyxis when using this medication. If oversedation occurs, administer 0.2 mg IV over 15 sec. If after 45 sec no response, administer 0.2 mg again over 1 min; may repeat at 1 min intervals; not to exceed 4 doses (1 mg)  . fentaNYL (SUBLIMAZE) injection 25-50 mcg    Make sure Narcan is available in the pyxis when using this medication. In the event of respiratory depression (RR< 8/min): Titrate NARCAN (naloxone) in increments of 0.1 to 0.2 mg IV at 2-3 minute intervals, until desired degree of reversal.  . ropivacaine (PF) 2 mg/mL (0.2%) (NAROPIN) injection 18 mL  . triamcinolone acetonide (KENALOG-40) injection 80 mg   Medications administered: We administered lidocaine, lactated ringers, midazolam, fentaNYL, ropivacaine (PF) 2 mg/mL (0.2%), and triamcinolone acetonide.  See the medical record for exact dosing, route, and time  of administration.  Follow-up plan:   Return in about 2 weeks (around 06/06/2020) for (F2F), (PP) Follow-up.       Interventional Pending:   Diagnostic bilateral lumbar facet block #1  (patient decided to have it done without sedation) (today 05/02/2020)   Under consideration:   Diagnostic bilateral lumbar facet block #1  Possible bilateral lumbar facet RFA    Diagnostic bilateral SI joint block #1  Possible bilateral SI joint RFA  Diagnostic left IA hip joint injection    Palliative treatment options:   None at this time      Recent Visits Date Type Provider Dept  05/20/20 Office Visit Milinda Pointer, MD [facility]  05/02/20 Procedure visit Milinda Pointer, MD [facility]  Showing recent visits within past 90 days and meeting all other requirements Today's Visits Date Type Provider Dept  05/23/20 Procedure visit Milinda Pointer, MD [facility]  Showing today's visits and meeting all other requirements Future Appointments Date Type Provider Dept  06/06/20 Appointment Milinda Pointer, MD [facility]  Showing future appointments within next 90 days and meeting all other requirements  Disposition: Discharge home  Discharge (Date  Time): 05/23/2020; 1235 hrs.   Primary Care Physician: Ferdie Ping, MD Location: [facility] Note by: Gaspar Cola, MD Date: 05/23/2020; Time: 12:33 PM  Disclaimer:  Medicine is not an exact science. The only guarantee in medicine is that nothing is guaranteed. It is important to note that the decision to proceed with this intervention was based on the information collected from the patient. The Data and conclusions were drawn from the patient's questionnaire, the interview, and the physical examination. Because the information was provided in large part by the patient, it cannot be guaranteed that it has not been purposely or unconsciously manipulated. Every effort has been made to obtain as much relevant data as possible for this evaluation. It is important to note that the conclusions that lead to this procedure are derived in large part from the available data. Always take into account that the treatment will also be dependent on availability of resources and existing treatment guidelines, considered by other Pain  Management Practitioners as being common knowledge and practice, at the time of the intervention. For Medico-Legal purposes, it is also important to point out that variation in procedural techniques and pharmacological choices are the acceptable norm. The indications, contraindications, technique, and results of the above procedure should only be interpreted and judged by a Board-Certified Interventional Pain Specialist with extensive familiarity and expertise in the same exact procedure and technique.

## 2020-05-23 ENCOUNTER — Encounter: Payer: Self-pay | Admitting: Pain Medicine

## 2020-05-23 ENCOUNTER — Other Ambulatory Visit: Payer: Self-pay

## 2020-05-23 ENCOUNTER — Ambulatory Visit
Admission: RE | Admit: 2020-05-23 | Discharge: 2020-05-23 | Disposition: A | Discharge status: Home / Self Care | Payer: No Typology Code available for payment source | Source: Ambulatory Visit | Attending: Pain Medicine | Admitting: Pain Medicine

## 2020-05-23 ENCOUNTER — Ambulatory Visit (HOSPITAL_BASED_OUTPATIENT_CLINIC_OR_DEPARTMENT_OTHER): Payer: No Typology Code available for payment source | Admitting: Pain Medicine

## 2020-05-23 VITALS — BP 141/70 | HR 72 | Temp 97.7°F | Resp 16 | Ht 70.0 in | Wt 170.0 lb

## 2020-05-23 DIAGNOSIS — M47816 Spondylosis without myelopathy or radiculopathy, lumbar region: Secondary | ICD-10-CM | POA: Diagnosis present

## 2020-05-23 DIAGNOSIS — G8929 Other chronic pain: Secondary | ICD-10-CM | POA: Diagnosis present

## 2020-05-23 DIAGNOSIS — M5136 Other intervertebral disc degeneration, lumbar region: Secondary | ICD-10-CM | POA: Diagnosis present

## 2020-05-23 DIAGNOSIS — M47817 Spondylosis without myelopathy or radiculopathy, lumbosacral region: Secondary | ICD-10-CM | POA: Diagnosis present

## 2020-05-23 DIAGNOSIS — M545 Low back pain, unspecified: Secondary | ICD-10-CM | POA: Diagnosis present

## 2020-05-23 MED ORDER — ROPIVACAINE HCL 2 MG/ML IJ SOLN
18.0000 mL | Freq: Once | INTRAMUSCULAR | Status: AC
  Administered 2020-05-23: 10 mL via PERINEURAL
  Filled 2020-05-23: qty 20

## 2020-05-23 MED ORDER — TRIAMCINOLONE ACETONIDE 40 MG/ML IJ SUSP
80.0000 mg | Freq: Once | INTRAMUSCULAR | Status: AC
  Administered 2020-05-23: 40 mg
  Filled 2020-05-23: qty 2

## 2020-05-23 MED ORDER — LACTATED RINGERS IV SOLN
1000.0000 mL | Freq: Once | INTRAVENOUS | Status: AC
  Administered 2020-05-23: 1000 mL via INTRAVENOUS

## 2020-05-23 MED ORDER — MIDAZOLAM HCL 5 MG/5ML IJ SOLN
1.0000 mg | INTRAMUSCULAR | Status: DC | PRN
  Administered 2020-05-23: 1 mg via INTRAVENOUS
  Filled 2020-05-23: qty 5

## 2020-05-23 MED ORDER — FENTANYL CITRATE (PF) 100 MCG/2ML IJ SOLN
25.0000 ug | INTRAMUSCULAR | Status: DC | PRN
  Administered 2020-05-23: 50 ug via INTRAVENOUS
  Filled 2020-05-23: qty 2

## 2020-05-23 MED ORDER — LIDOCAINE HCL 2 % IJ SOLN
20.0000 mL | Freq: Once | INTRAMUSCULAR | Status: AC
  Administered 2020-05-23: 400 mg
  Filled 2020-05-23: qty 20

## 2020-05-23 NOTE — Progress Notes (Signed)
Safety precautions to be maintained throughout the outpatient stay will include: orient to surroundings, keep bed in low position, maintain call bell within reach at all times, provide assistance with transfer out of bed and ambulation.  

## 2020-05-24 ENCOUNTER — Telehealth: Payer: Self-pay

## 2020-05-24 NOTE — Telephone Encounter (Signed)
Called PP. Denies any needs at this time. He states he is just waiting for everything to work after the numbness was gone. Instructed to call if needed.

## 2020-06-06 ENCOUNTER — Ambulatory Visit: Payer: No Typology Code available for payment source | Attending: Pain Medicine | Admitting: Pain Medicine

## 2020-06-06 ENCOUNTER — Encounter: Payer: Self-pay | Admitting: Pain Medicine

## 2020-06-06 ENCOUNTER — Other Ambulatory Visit: Payer: Self-pay

## 2020-06-06 VITALS — BP 157/78 | HR 68 | Temp 97.4°F | Resp 16 | Ht 70.0 in | Wt 170.0 lb

## 2020-06-06 DIAGNOSIS — M7918 Myalgia, other site: Secondary | ICD-10-CM | POA: Insufficient documentation

## 2020-06-06 DIAGNOSIS — M545 Low back pain, unspecified: Secondary | ICD-10-CM | POA: Diagnosis not present

## 2020-06-06 DIAGNOSIS — G8929 Other chronic pain: Secondary | ICD-10-CM | POA: Diagnosis present

## 2020-06-06 DIAGNOSIS — M533 Sacrococcygeal disorders, not elsewhere classified: Secondary | ICD-10-CM | POA: Diagnosis present

## 2020-06-06 DIAGNOSIS — M47816 Spondylosis without myelopathy or radiculopathy, lumbar region: Secondary | ICD-10-CM | POA: Diagnosis not present

## 2020-06-06 DIAGNOSIS — M779 Enthesopathy, unspecified: Secondary | ICD-10-CM | POA: Diagnosis not present

## 2020-06-06 DIAGNOSIS — M9904 Segmental and somatic dysfunction of sacral region: Secondary | ICD-10-CM | POA: Diagnosis present

## 2020-06-06 DIAGNOSIS — G894 Chronic pain syndrome: Secondary | ICD-10-CM | POA: Diagnosis present

## 2020-06-06 MED ORDER — CYCLOBENZAPRINE HCL 5 MG PO TABS: 5.0000 mg | ORAL_TABLET | Freq: Every day | ORAL | 0 refills | Status: DC

## 2020-06-06 NOTE — Progress Notes (Signed)
PROVIDER NOTE: Information contained herein reflects review and annotations entered in association with encounter. Interpretation of such information and data should be left to medically-trained personnel. Information provided to patient can be located elsewhere in the medical record under "Patient Instructions". Document created using STT-dictation technology, any transcriptional errors that may result from process are unintentional.    Patient: Albert Gordon  Service Category: E/M  Provider: Gaspar Cola, MD  DOB: June 08, 1946  DOS: 06/06/2020  Specialty: Interventional Pain Management  MRN: [id number]  Setting: Ambulatory outpatient  PCP: Ferdie Ping, MD  Type: Established Patient    Referring Provider: Ferdie Ping, MD  Location: Office  Delivery: Face-to-face     HPI  [name], a 74 y.o. year old male, is here today because of his Chronic pain syndrome [G89.4]. Albert Gordon primary complain today is Back Pain (lower) Last encounter: My last encounter with him was on 05/23/2020. Pertinent problems: Albert Gordon has Chronic pain syndrome; Chronic low back pain (1ry area of Pain) (Bilateral) (L>R) w/o sciatica; Lumbar facet syndrome (Bilateral) (L>R); Chronic lower extremity pain (Bilateral) (L>R); Chronic hip pain (Left); Chronic sacroiliac joint pain (Bilateral) (L>R); Somatic dysfunction of sacroiliac joints (Bilateral); Spondylosis without myelopathy or radiculopathy, lumbosacral region; Lumbar facet arthropathy (Multilevel) (Bilateral); Lumbar facet hypertrophy (Multilevel) (Bilateral); DDD (degenerative disc disease), lumbar; Degenerative joint disease (DJD) of lumbar spine; Osteoarthritis of facet joint of lumbar spine; Osteoarthritis of lumbar spine; 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 Pain Assessment:  Severity of Chronic pain is reported as a 3 /10. Location: Back Lower/both upper legs. Onset: More than a month ago. Quality: Dull. Timing: Constant. Modifying factor(s): Hydrocodone. Vitals:  height is '5\' 10"'  (1.778 m) and weight is 170 lb (77.1 kg). His temporal temperature is 97.4 F (36.3 C) (abnormal). His blood pressure is 157/78 (abnormal) and his pulse is 68. His respiration is 16 and oxygen saturation is 98%.   Reason for encounter: post-procedure assessment.  According to the patient the diagnostic bilateral lumbar facet block provided him with 100% relief of the pain for the duration of the local anesthetic in terms of the pain in the lower back.  He does have some pain along the lower around the PSIS area, which he indicates has not gotten better.  The one above that did and has continued to remain low.  The patient also indicates that this pain seemed to aggravate at the end of the day and at night suggesting the possibility that this may be associated with muscle more than joint.  To test this, I have proposed to do a trial of Flexeril to see if by taking a muscle relaxant at night this can improve the pain.  Physical exam today demonstrated tenderness over the sacral sulcus, bilaterally.  Pelvic distraction test did not seem to significantly increase the pain.  Thigh thrust did trigger some discomfort, bilaterally Patrick's maneuver was positive bilaterally for hip arthralgia and it did seem to aggravate some of the discomfort on the right side from the SI joint.  Compression test and Gaenslen test seem to be positive for bilateral sacroiliac joint arthralgia.  Based on my exam, he seems to be having an enthesopathy affecting the posterior sacroiliac joint ligaments, bilaterally.  The plan was shared with the patient who understood and accepted.  Post-Procedure Evaluation  Procedure (05/23/2020): Diagnostic bilateral lumbar facet block #  2 under fluoroscopic guidance and IV  sedation Pre-procedure pain level: 5/10 Post-procedure: 0/10 (100% relief)  Sedation: Sedation provided.  Effectiveness during initial hour after procedure(Ultra-Short Term Relief): 100 %.  Local anesthetic used: Long-acting (4-6 hours) Effectiveness: Defined as any analgesic benefit obtained secondary to the administration of local anesthetics. This carries significant diagnostic value as to the etiological location, or anatomical origin, of the pain. Duration of benefit is expected to coincide with the duration of the local anesthetic used.  Effectiveness during initial 4-6 hours after procedure(Short-Term Relief): 100 %.  Long-term benefit: Defined as any relief past the pharmacologic duration of the local anesthetics.  Effectiveness past the initial 6 hours after procedure(Long-Term Relief): 70 %.  Current benefits: Defined as benefit that persist at this time.   Analgesia:  >50% relief of the upper back pain, but he still has some pain in the area of the PSIS, bilaterally. Function: Somewhat improved ROM: Somewhat improved  Pharmacotherapy Assessment   Analgesic: Morphine ER 15 mg daily (15 MME) + hydrocodone/APAP 10/325 1 tablet every 8 hours (45 MME/day) written by the "[facility] [employer] [facility]" MME/day: 60 mg/day   Monitoring: [place] PMP: PDMP reviewed during this encounter.       Pharmacotherapy: No side-effects or adverse reactions reported. Compliance: No problems identified. Effectiveness: Clinically acceptable.  [name], RN  06/06/2020  8:26 AM  Sign when Signing Visit Safety precautions to be maintained throughout the outpatient stay will include: orient to surroundings, keep bed in low position, maintain call bell within reach at all times, provide assistance with transfer out of bed and ambulation.     UDS:  Summary  Date Value Ref Range Status  01/29/2020 Note  Final    Comment:     ==================================================================== Compliance Drug Analysis, Ur ==================================================================== Test                             Result       Flag       Units  Drug Present and Declared for Prescription Verification   Morphine                       3126         EXPECTED   ng/mg creat   Normorphine                    100          EXPECTED   ng/mg creat    Potential sources of morphine include administration of codeine or    morphine, use of heroin, or ingestion of poppy seeds.     Normorphine is an expected metabolite of morphine.    Hydrocodone                    187          EXPECTED   ng/mg creat   Dihydrocodeine                 169          EXPECTED   ng/mg creat   Norhydrocodone                 3061         EXPECTED   ng/mg creat    Sources of hydrocodone include scheduled prescription medications.    Dihydrocodeine and norhydrocodone are expected metabolites of    hydrocodone. Dihydrocodeine  is also available as a scheduled    prescription medication.    Acetaminophen                  PRESENT      EXPECTED ==================================================================== Test                      Result    Flag   Units      Ref Range   Creatinine              61               mg/dL      >=20 ==================================================================== Declared Medications:  The flagging and interpretation on this report are based on the  following declared medications.  Unexpected results may arise from  inaccuracies in the declared medications.   **Note: The testing scope of this panel includes these medications:   Hydrocodone (Norco)  Morphine (MSIR)   **Note: The testing scope of this panel does not include small to  moderate amounts of these reported medications:   Acetaminophen (Norco)   **Note: The testing scope of this panel does not include the  following reported medications:    Atorvastatin (Lipitor)  Hydrochlorothiazide (Maxzide)  Tamsulosin (Flomax)  Terazosin (Hytrin)  Triamterene (Maxzide) ==================================================================== For clinical consultation, please call [phone]. ====================================================================      ROS  Constitutional: Denies any fever or chills Gastrointestinal: No reported hemesis, hematochezia, vomiting, or acute GI distress Musculoskeletal: Denies any acute onset joint swelling, redness, loss of ROM, or weakness Neurological: No reported episodes of acute onset apraxia, aphasia, dysarthria, agnosia, amnesia, paralysis, loss of coordination, or loss of consciousness  Medication Review  HYDROcodone-acetaminophen, cyclobenzaprine, morphine, tamsulosin, terazosin, and triamterene-hydrochlorothiazide  History Review  Allergy: Mr. Huar[place] has No Known Allergies. Drug: Mr. Kissler  reports previous drug use. Alcohol:  reports current alcohol use. Tobacco:  reports that he has quit smoking. He has quit using smokeless tobacco. Social: Mr. Esguerra  reports that he has quit smoking. He has quit using smokeless tobacco. He reports current alcohol use. He reports previous drug use. Medical:  has a past medical history of Hypertension. Surgical: Mr. Thune  has a past surgical history that includes Hernia repair. Family: family history is not on file.  Laboratory Chemistry Profile   Renal Lab Results  Component Value Date   BUN 15 01/29/2020   CREATININE 0.93 01/29/2020   BCR 16 01/29/2020   GFRAA 93 01/29/2020   GFRNONAA 80 01/29/2020     Hepatic Lab Results  Component Value Date   AST 18 01/29/2020   ALBUMIN 4.4 01/29/2020   ALKPHOS 50 01/29/2020     Electrolytes Lab Results  Component Value Date   NA 142 01/29/2020   K 4.8 01/29/2020   CL 102 01/29/2020   CALCIUM 9.4 01/29/2020   MG 2.3 01/29/2020     Bone Lab Results  Component Value Date   25OHVITD1  54 01/29/2020   25OHVITD2 <1.0 01/29/2020   25OHVITD3 54 01/29/2020     Inflammation (CRP: Acute Phase) (ESR: Chronic Phase) Lab Results  Component Value Date   CRP <1 01/29/2020   ESRSEDRATE 2 01/29/2020       Note: Above Lab results reviewed.  Recent Imaging Review  DG PAIN CLINIC C-ARM 1-60 MIN NO REPORT Fluoro was used, but no Radiologist interpretation will be provided.  Please refer to "NOTES" tab for provider progress note. Note: Reviewed  Physical Exam  General appearance: Well nourished, well developed, and well hydrated. In no apparent acute distress Mental status: Alert, oriented x 3 (person, place, & time)       Respiratory: No evidence of acute respiratory distress Eyes: PERLA Vitals: BP (!) 157/78   Pulse 68   Temp (!) 97.4 F (36.3 C) (Temporal)   Resp 16   Ht '5\' 10"'  (1.778 m)   Wt 170 lb (77.1 kg)   SpO2 98%   BMI 24.39 kg/m  BMI: Estimated body mass index is 24.39 kg/m as calculated from the following:   Height as of this encounter: '5\' 10"'  (1.778 m).   Weight as of this encounter: 170 lb (77.1 kg). Ideal: Ideal body weight: 73 kg (160 lb 15 oz) Adjusted ideal body weight: 74.6 kg (164 lb 9 oz)  Assessment   Status Diagnosis  Persistent Improving Improved 1. Chronic pain syndrome   2. Chronic low back pain (1ry area of Pain) (Bilateral) (L>R) w/o sciatica   3. Lumbar facet syndrome (Bilateral) (L>R)   4. Enthesopathy of sacroiliac joint (B)   5. Chronic musculoskeletal pain   6. Chronic sacroiliac joint pain (Bilateral) (L>R)   7. Somatic dysfunction of sacroiliac joints (Bilateral)      Updated Problems: Problem  Enthesopathy of sacroiliac joint (Bilateral)  Chronic Musculoskeletal Pain    Plan of Care  Problem-specific:  No problem-specific Assessment & Plan notes found for this encounter.  Mr. KENNETT SYMES has a current medication list which includes the following long-term medication(s): terazosin,  triamterene-hydrochlorothiazide, and cyclobenzaprine.  Pharmacotherapy (Medications Ordered): Meds ordered this encounter  Medications  . cyclobenzaprine (FLEXERIL) 5 MG tablet    Sig: Take 1-2 tablets (5-10 mg total) by mouth at bedtime.    Dispense:  60 tablet    Refill:  0    Do not place this medication, or any other prescription from our practice, on "Automatic Refill". Patient may have prescription filled one day early if pharmacy is closed on scheduled refill date.   Orders:  Orders Placed This Encounter  Procedures  . SACROILIAC JOINT INJECTION    Standing Status:   Future    Standing Expiration Date:   07/06/2020    Scheduling Instructions:     Side: Bilateral     Sedation: Patient's choice.     Timeframe: ASAP    Order Specific Question:   Where will this procedure be performed?    Answer:   [facility]   Follow-up plan:   Return for Procedure (w/ sedation): (B) SI BLK #1.      Interventional Pending:   Therapeutic bilateral lumbar facet RFA #1, starting with the left side.   Under consideration:   Possible bilateral lumbar facet RFA #1   Diagnostic bilateral SI joint block #1  Possible bilateral SI joint RFA  Diagnostic left IA hip joint injection    Palliative treatment options:   Palliative bilateral lumbar facet block #3     Recent Visits Date Type Provider Dept  05/23/20 Procedure visit Milinda Pointer, MD [facility]  05/20/20 Office Visit Milinda Pointer, MD [facility]  05/02/20 Procedure visit Milinda Pointer, MD [facility]  Showing recent visits within past 90 days and meeting all other requirements Today's Visits Date Type Provider Dept  06/06/20 Office Visit Milinda Pointer, MD [facility]  Showing today's visits and meeting all other requirements Future Appointments No visits were found meeting these conditions. Showing future appointments within next  90 days and meeting all other  requirements  I discussed the assessment and treatment plan with the patient. The patient was provided an opportunity to ask questions and all were answered. The patient agreed with the plan and demonstrated an understanding of the instructions.  Patient advised to call back or seek an in-person evaluation if the symptoms or condition worsens.  Duration of encounter: 69 minutes.  Note by: Gaspar Cola, MD Date: 06/06/2020; Time: 1:46 PM

## 2020-06-06 NOTE — Patient Instructions (Signed)
____________________________________________________________________________________________  Preparing for Procedure with Sedation  Procedure appointments are limited to planned procedures: . No Prescription Refills. . No disability issues will be discussed. . No medication changes will be discussed.  Instructions: . Oral Intake: Do not eat or drink anything for at least 8 hours prior to your procedure. (Exception: Blood Pressure Medication. See below.) . Transportation: Unless otherwise stated by your physician, you may drive yourself after the procedure. . Blood Pressure Medicine: Do not forget to take your blood pressure medicine with a sip of water the morning of the procedure. If your Diastolic (lower reading)is above 100 mmHg, elective cases will be cancelled/rescheduled. . Blood thinners: These will need to be stopped for procedures. Notify our staff if you are taking any blood thinners. Depending on which one you take, there will be specific instructions on how and when to stop it. . Diabetics on insulin: Notify the staff so that you can be scheduled 1st case in the morning. If your diabetes requires high dose insulin, take only  of your normal insulin dose the morning of the procedure and notify the staff that you have done so. . Preventing infections: Shower with an antibacterial soap the morning of your procedure. . Build-up your immune system: Take 1000 mg of Vitamin C with every meal (3 times a day) the day prior to your procedure. . Antibiotics: Inform the staff if you have a condition or reason that requires you to take antibiotics before dental procedures. . Pregnancy: If you are pregnant, call and cancel the procedure. . Sickness: If you have a cold, fever, or any active infections, call and cancel the procedure. . Arrival: You must be in the facility at least 30 minutes prior to your scheduled procedure. . Children: Do not bring children with you. . Dress appropriately:  Bring dark clothing that you would not mind if they get stained. . Valuables: Do not bring any jewelry or valuables.  Reasons to call and reschedule or cancel your procedure: (Following these recommendations will minimize the risk of a serious complication.) . Surgeries: Avoid having procedures within 2 weeks of any surgery. (Avoid for 2 weeks before or after any surgery). . Flu Shots: Avoid having procedures within 2 weeks of a flu shots or . (Avoid for 2 weeks before or after immunizations). . Barium: Avoid having a procedure within 7-10 days after having had a radiological study involving the use of radiological contrast. (Myelograms, Barium swallow or enema study). . Heart attacks: Avoid any elective procedures or surgeries for the initial 6 months after a "Myocardial Infarction" (Heart Attack). . Blood thinners: It is imperative that you stop these medications before procedures. Let us know if you if you take any blood thinner.  . Infection: Avoid procedures during or within two weeks of an infection (including chest colds or gastrointestinal problems). Symptoms associated with infections include: Localized redness, fever, chills, night sweats or profuse sweating, burning sensation when voiding, cough, congestion, stuffiness, runny nose, sore throat, diarrhea, nausea, vomiting, cold or Flu symptoms, recent or current infections. It is specially important if the infection is over the area that we intend to treat. . Heart and lung problems: Symptoms that may suggest an active cardiopulmonary problem include: cough, chest pain, breathing difficulties or shortness of breath, dizziness, ankle swelling, uncontrolled high or unusually low blood pressure, and/or palpitations. If you are experiencing any of these symptoms, cancel your procedure and contact your primary care physician for an evaluation.  Remember:  Regular Business hours are:    Monday to Thursday 8:00 AM to 4:00 PM  Provider's  Schedule: Jeanelle Dake, MD:  Procedure days: Tuesday and Thursday 7:30 AM to 4:00 PM  Bilal Lateef, MD:  Procedure days: Monday and Wednesday 7:30 AM to 4:00 PM ____________________________________________________________________________________________    

## 2020-06-06 NOTE — Progress Notes (Signed)
Safety precautions to be maintained throughout the outpatient stay will include: orient to surroundings, keep bed in low position, maintain call bell within reach at all times, provide assistance with transfer out of bed and ambulation.  

## 2020-07-08 ENCOUNTER — Other Ambulatory Visit: Payer: Self-pay | Admitting: Pain Medicine

## 2020-07-08 DIAGNOSIS — G8929 Other chronic pain: Secondary | ICD-10-CM

## 2020-10-08 ENCOUNTER — Ambulatory Visit (HOSPITAL_BASED_OUTPATIENT_CLINIC_OR_DEPARTMENT_OTHER): Payer: No Typology Code available for payment source | Admitting: Pain Medicine

## 2020-10-08 ENCOUNTER — Ambulatory Visit
Admission: RE | Admit: 2020-10-08 | Discharge: 2020-10-08 | Disposition: A | Discharge status: Home / Self Care | Payer: No Typology Code available for payment source | Source: Ambulatory Visit | Attending: Pain Medicine | Admitting: Pain Medicine

## 2020-10-08 ENCOUNTER — Other Ambulatory Visit: Payer: Self-pay

## 2020-10-08 VITALS — BP 152/79 | HR 57 | Temp 97.8°F | Resp 12 | Ht 70.0 in | Wt 170.0 lb

## 2020-10-08 DIAGNOSIS — M545 Low back pain, unspecified: Secondary | ICD-10-CM | POA: Diagnosis not present

## 2020-10-08 DIAGNOSIS — E7849 Other hyperlipidemia: Secondary | ICD-10-CM | POA: Insufficient documentation

## 2020-10-08 DIAGNOSIS — M9904 Segmental and somatic dysfunction of sacral region: Secondary | ICD-10-CM | POA: Insufficient documentation

## 2020-10-08 DIAGNOSIS — M779 Enthesopathy, unspecified: Secondary | ICD-10-CM

## 2020-10-08 DIAGNOSIS — M47898 Other spondylosis, sacral and sacrococcygeal region: Secondary | ICD-10-CM | POA: Insufficient documentation

## 2020-10-08 DIAGNOSIS — M533 Sacrococcygeal disorders, not elsewhere classified: Secondary | ICD-10-CM | POA: Insufficient documentation

## 2020-10-08 DIAGNOSIS — G4733 Obstructive sleep apnea (adult) (pediatric): Secondary | ICD-10-CM | POA: Insufficient documentation

## 2020-10-08 DIAGNOSIS — E785 Hyperlipidemia, unspecified: Secondary | ICD-10-CM | POA: Insufficient documentation

## 2020-10-08 DIAGNOSIS — Z23 Encounter for immunization: Secondary | ICD-10-CM | POA: Insufficient documentation

## 2020-10-08 DIAGNOSIS — M47818 Spondylosis without myelopathy or radiculopathy, sacral and sacrococcygeal region: Secondary | ICD-10-CM | POA: Diagnosis not present

## 2020-10-08 DIAGNOSIS — Z719 Counseling, unspecified: Secondary | ICD-10-CM | POA: Insufficient documentation

## 2020-10-08 DIAGNOSIS — G8929 Other chronic pain: Secondary | ICD-10-CM

## 2020-10-08 DIAGNOSIS — M7062 Trochanteric bursitis, left hip: Secondary | ICD-10-CM | POA: Insufficient documentation

## 2020-10-08 DIAGNOSIS — K4090 Unilateral inguinal hernia, without obstruction or gangrene, not specified as recurrent: Secondary | ICD-10-CM | POA: Insufficient documentation

## 2020-10-08 DIAGNOSIS — Z7189 Other specified counseling: Secondary | ICD-10-CM | POA: Insufficient documentation

## 2020-10-08 DIAGNOSIS — L219 Seborrheic dermatitis, unspecified: Secondary | ICD-10-CM | POA: Insufficient documentation

## 2020-10-08 DIAGNOSIS — I10 Essential (primary) hypertension: Secondary | ICD-10-CM | POA: Insufficient documentation

## 2020-10-08 DIAGNOSIS — Z1211 Encounter for screening for malignant neoplasm of colon: Secondary | ICD-10-CM | POA: Insufficient documentation

## 2020-10-08 DIAGNOSIS — E538 Deficiency of other specified B group vitamins: Secondary | ICD-10-CM | POA: Insufficient documentation

## 2020-10-08 DIAGNOSIS — Z87891 Personal history of nicotine dependence: Secondary | ICD-10-CM | POA: Insufficient documentation

## 2020-10-08 DIAGNOSIS — M4608 Spinal enthesopathy, sacral and sacrococcygeal region: Secondary | ICD-10-CM | POA: Diagnosis not present

## 2020-10-08 DIAGNOSIS — Z860100 Personal history of colon polyps, unspecified: Secondary | ICD-10-CM | POA: Insufficient documentation

## 2020-10-08 DIAGNOSIS — D120 Benign neoplasm of cecum: Secondary | ICD-10-CM | POA: Insufficient documentation

## 2020-10-08 DIAGNOSIS — H26109 Unspecified traumatic cataract, unspecified eye: Secondary | ICD-10-CM | POA: Insufficient documentation

## 2020-10-08 MED ORDER — LIDOCAINE HCL 2 % IJ SOLN
20.0000 mL | Freq: Once | INTRAMUSCULAR | Status: AC
  Administered 2020-10-08: 400 mg
  Filled 2020-10-08: qty 40

## 2020-10-08 MED ORDER — LACTATED RINGERS IV SOLN
1000.0000 mL | Freq: Once | INTRAVENOUS | Status: AC
  Administered 2020-10-08: 1000 mL via INTRAVENOUS

## 2020-10-08 MED ORDER — ROPIVACAINE HCL 2 MG/ML IJ SOLN
9.0000 mL | Freq: Once | INTRAMUSCULAR | Status: AC
  Administered 2020-10-08: 9 mL via INTRA_ARTICULAR
  Filled 2020-10-08: qty 10

## 2020-10-08 MED ORDER — MIDAZOLAM HCL 5 MG/5ML IJ SOLN
1.0000 mg | INTRAMUSCULAR | Status: DC | PRN
  Administered 2020-10-08: 2 mg via INTRAVENOUS
  Filled 2020-10-08: qty 5

## 2020-10-08 MED ORDER — FENTANYL CITRATE (PF) 100 MCG/2ML IJ SOLN
25.0000 ug | INTRAMUSCULAR | Status: DC | PRN
  Administered 2020-10-08: 50 ug via INTRAVENOUS
  Filled 2020-10-08: qty 2

## 2020-10-08 MED ORDER — METHYLPREDNISOLONE ACETATE 80 MG/ML IJ SUSP
80.0000 mg | Freq: Once | INTRAMUSCULAR | Status: AC
  Administered 2020-10-08: 80 mg via INTRA_ARTICULAR
  Filled 2020-10-08: qty 1

## 2020-10-08 NOTE — Patient Instructions (Signed)

## 2020-10-08 NOTE — Progress Notes (Signed)
PROVIDER NOTE: Information contained herein reflects review and annotations entered in association with encounter. Interpretation of such information and data should be left to medically-trained personnel. Information provided to patient can be located elsewhere in the medical record under "Patient Instructions". Document created using STT-dictation technology, any transcriptional errors that may result from process are unintentional.    Patient: Albert Gordon  Service Category: Procedure  Provider: Gaspar Cola, MD  DOB: January 26, 1946  DOS: 10/08/2020  Location: [facility]  MRN: [id number]  Setting: Ambulatory - outpatient  Referring Provider: Ferdie Ping, MD  Type: Established Patient  Specialty: Interventional Pain Management  PCP: Ferdie Ping, MD   Primary Reason for Visit: Interventional Pain Management Treatment. CC: Back Pain (lower)  Procedure:          Anesthesia, Analgesia, Anxiolysis:  Type: Diagnostic Sacroiliac Joint Steroid Injection          Region: Superior Lumbosacral Region Level: PSIS (Posterior Superior Iliac Spine) Laterality: Bilateral  Type: Moderate (Conscious) Sedation combined with Local Anesthesia Indication(s): Analgesia and Anxiety Route: Intravenous (IV) IV Access: Secured Sedation: Meaningful verbal contact was maintained at all times during the procedure  Local Anesthetic: Lidocaine 1-2%  Position: Prone           Indications: 1. Chronic sacroiliac joint pain (Bilateral) (L>R)   2. Enthesopathy of sacroiliac joint (Bilateral)   3. Somatic dysfunction of sacroiliac joints (Bilateral)   4. Other spondylosis, sacral and sacrococcygeal region   5. Chronic low back pain (1ry area of Pain) (Bilateral) (L>R) w/o sciatica    Pain Score: Pre-procedure: 7 /10 Post-procedure: 0-No pain/10   Pre-op H&P Assessment:  Albert Gordon is a 75 y.o. (year old), male patient, seen today for interventional treatment. He  has a past surgical  history that includes Hernia repair. Albert Gordon has a current medication list which includes the following prescription(s): hydrocodone-acetaminophen, morphine, salicyclic acid-sulfur, tamsulosin, terazosin, triamterene-hydrochlorothiazide, and cyclobenzaprine, and the following Facility-Administered Medications: fentanyl and midazolam. His primarily concern today is the Back Pain (lower)  Initial Vital Signs:  Pulse/HCG Rate: (!) 57ECG Heart Rate: (!) 53 Temp: (!) 97.3 F (36.3 C) Resp: 16 BP: (!) 144/73 SpO2: 99 %  BMI: Estimated body mass index is 24.39 kg/m as calculated from the following:   Height as of this encounter: 5\' 10"  (1.778 m).   Weight as of this encounter: 170 lb (77.1 kg).  Risk Assessment: Allergies: Reviewed. He has No Known Allergies.  Allergy Precautions: None required Coagulopathies: Reviewed. None identified.  Blood-thinner therapy: None at this time Active Infection(s): Reviewed. None identified. Albert Gordon is afebrile  Site Confirmation: Albert Gordon was asked to confirm the procedure and laterality before marking the site Procedure checklist: Completed Consent: Before the procedure and under the influence of no sedative(s), amnesic(s), or anxiolytics, the patient was informed of the treatment options, risks and possible complications. To fulfill our ethical and legal obligations, as recommended by the American Medical Association's Code of Ethics, I have informed the patient of my clinical impression; the nature and purpose of the treatment or procedure; the risks, benefits, and possible complications of the intervention; the alternatives, including doing nothing; the risk(s) and benefit(s) of the alternative treatment(s) or procedure(s); and the risk(s) and benefit(s) of doing nothing. The patient was provided information about the general risks and possible complications associated with the procedure. These may include, but are not limited to: failure to achieve  desired goals, infection, bleeding, organ or nerve damage, allergic reactions,  paralysis, and death. In addition, the patient was informed of those risks and complications associated to the procedure, such as failure to decrease pain; infection; bleeding; organ or nerve damage with subsequent damage to sensory, motor, and/or autonomic systems, resulting in permanent pain, numbness, and/or weakness of one or several areas of the body; allergic reactions; (i.e.: anaphylactic reaction); and/or death. Furthermore, the patient was informed of those risks and complications associated with the medications. These include, but are not limited to: allergic reactions (i.e.: anaphylactic or anaphylactoid reaction(s)); adrenal axis suppression; blood sugar elevation that in diabetics may result in ketoacidosis or comma; water retention that in patients with history of congestive heart failure may result in shortness of breath, pulmonary edema, and decompensation with resultant heart failure; weight gain; swelling or edema; medication-induced neural toxicity; particulate matter embolism and blood vessel occlusion with resultant organ, and/or nervous system infarction; and/or aseptic necrosis of one or more joints. Finally, the patient was informed that Medicine is not an exact science; therefore, there is also the possibility of unforeseen or unpredictable risks and/or possible complications that may result in a catastrophic outcome. The patient indicated having understood very clearly. We have given the patient no guarantees and we have made no promises. Enough time was given to the patient to ask questions, all of which were answered to the patient's satisfaction. Albert Gordon has indicated that he wanted to continue with the procedure. Attestation: I, the ordering provider, attest that I have discussed with the patient the benefits, risks, side-effects, alternatives, likelihood of achieving goals, and potential problems  during recovery for the procedure that I have provided informed consent. Date  Time: 10/08/2020  9:40 AM  Pre-Procedure Preparation:  Monitoring: As per clinic protocol. Respiration, ETCO2, SpO2, BP, heart rate and rhythm monitor placed and checked for adequate function Safety Precautions: Patient was assessed for positional comfort and pressure points before starting the procedure. Time-out: I initiated and conducted the "Time-out" before starting the procedure, as per protocol. The patient was asked to participate by confirming the accuracy of the "Time Out" information. Verification of the correct person, site, and procedure were performed and confirmed by me, the nursing staff, and the patient. "Time-out" conducted as per Joint Commission's Universal Protocol (UP.01.01.01). Time: 1050  Description of Procedure:          Target Area: Superior, posterior, aspect of the sacroiliac fissure Approach: Posterior, paraspinal, ipsilateral approach. Area Prepped: Entire Lower Lumbosacral Region DuraPrep (Iodine Povacrylex [0.7% available iodine] and Isopropyl Alcohol, 74% w/w) Safety Precautions: Aspiration looking for blood return was conducted prior to all injections. At no point did we inject any substances, as a needle was being advanced. No attempts were made at seeking any paresthesias. Safe injection practices and needle disposal techniques used. Medications properly checked for expiration dates. SDV (single dose vial) medications used. Description of the Procedure: Protocol guidelines were followed. The patient was placed in position over the procedure table. The target area was identified and the area prepped in the usual manner. Skin & deeper tissues infiltrated with local anesthetic. Appropriate amount of time allowed to pass for local anesthetics to take effect. The procedure needle was advanced under fluoroscopic guidance into the sacroiliac joint until a firm endpoint was obtained. Proper  needle placement secured. Negative aspiration confirmed. Solution injected in intermittent fashion, asking for systemic symptoms every 0.5cc of injectate. The needles were then removed and the area cleansed, making sure to leave some of the prepping solution back to take advantage of its  long term bactericidal properties. Vitals:   10/08/20 1055 10/08/20 1105 10/08/20 1114 10/08/20 1125  BP: 115/70 140/81 139/89 (!) 152/79  Pulse:      Resp: 16 13 10 12   Temp:  98 F (36.7 C)  97.8 F (36.6 C)  TempSrc:  Temporal  Temporal  SpO2: 97% 97% 98% 97%  Weight:      Height:        Start Time: 1050 hrs. End Time: 1055 hrs. Materials:  Needle(s) Type: Spinal Needle Gauge: 22G Length: 3.5-in Medication(s): Please see orders for medications and dosing details.  Imaging Guidance (Non-Spinal):          Type of Imaging Technique: Fluoroscopy Guidance (Non-Spinal) Indication(s): Assistance in needle guidance and placement for procedures requiring needle placement in or near specific anatomical locations not easily accessible without such assistance. Exposure Time: Please see nurses notes. Contrast: Before injecting any contrast, we confirmed that the patient did not have an allergy to iodine, shellfish, or radiological contrast. Once satisfactory needle placement was completed at the desired level, radiological contrast was injected. Contrast injected under live fluoroscopy. No contrast complications. See chart for type and volume of contrast used. Fluoroscopic Guidance: I was personally present during the use of fluoroscopy. "Tunnel Vision Technique" used to obtain the best possible view of the target area. Parallax error corrected before commencing the procedure. "Direction-depth-direction" technique used to introduce the needle under continuous pulsed fluoroscopy. Once target was reached, antero-posterior, oblique, and lateral fluoroscopic projection used confirm needle placement in all planes. Images  permanently stored in EMR. Interpretation: I personally interpreted the imaging intraoperatively. Adequate needle placement confirmed in multiple planes. Appropriate spread of contrast into desired area was observed. No evidence of afferent or efferent intravascular uptake. Permanent images saved into the patient's record.  Antibiotic Prophylaxis:   Anti-infectives (From admission, onward)   None     Indication(s): None identified  Post-operative Assessment:  Post-procedure Vital Signs:  Pulse/HCG Rate: (!) 57(!) 57 Temp: 97.8 F (36.6 C) Resp: 12 BP: (!) 152/79 SpO2: 97 %  EBL: None  Complications: No immediate post-treatment complications observed by team, or reported by patient.  Note: The patient tolerated the entire procedure well. A repeat set of vitals were taken after the procedure and the patient was kept under observation following institutional policy, for this type of procedure. Post-procedural neurological assessment was performed, showing return to baseline, prior to discharge. The patient was provided with post-procedure discharge instructions, including a section on how to identify potential problems. Should any problems arise concerning this procedure, the patient was given instructions to immediately contact us, at any time, without hesitation. In any case, we plan to contact the patient by telephone for a follow-up status report regarding this interventional procedure.  Comments:  No additional relevant information.  Plan of Care  Orders:  Orders Placed This Encounter  Procedures  . SACROILIAC JOINT INJECTION    Scheduling Instructions:     Side: Bilateral     Sedation: Patient's choice.     Timeframe: Today    Order Specific Question:   Where will this procedure be performed?    Answer:   [facility]  . [facility] C-ARM 1-60 MIN NO REPORT    Intraoperative interpretation by procedural physician at [place].    Standing Status:    Standing    Number of Occurrences:   1    Order Specific Question:   Reason for exam:    Answer:  Assistance in needle guidance and placement for procedures requiring needle placement in or near specific anatomical locations not easily accessible without such assistance.  . Informed Consent Details: Physician/Practitioner Attestation; Transcribe to consent form and obtain patient signature    Nursing Order: Transcribe to consent form and obtain patient signature. Note: Always confirm laterality of pain with Mr. Demarest, before procedure.    Order Specific Question:   Physician/Practitioner attestation of informed consent for procedure/surgical case    Answer:   I, the physician/practitioner, attest that I have discussed with the patient the benefits, risks, side effects, alternatives, likelihood of achieving goals and potential problems during recovery for the procedure that I have provided informed consent.    Order Specific Question:   Procedure    Answer:   Sacroiliac Joint Block    Order Specific Question:   Physician/Practitioner performing the procedure    Answer:   Yehonatan Grandison A. Dossie Arbour, MD    Order Specific Question:   Indication/Reason    Answer:   Chronic Low Back and Hip Pain secondary to Sacroiliac Joint Pain (Arthralgia/Arthropathy)  . Provide equipment / supplies at bedside    "Block Tray" (Disposable  single use) Needle type: SpinalSpinal Amount/quantity: 2 Size: Medium (5-inch) Gauge: 22G    Standing Status:   Standing    Number of Occurrences:   1    Order Specific Question:   Specify    Answer:   Block Tray   Chronic Opioid Analgesic:  Morphine ER 15 mg daily (15 MME) + hydrocodone/APAP 10/325 1 tablet every 8 hours (45 MME/day) written by the "[facility]" MME/day: 60 mg/day   Medications ordered for procedure: Meds ordered this encounter  Medications  . lidocaine (XYLOCAINE) 2 % (with pres) injection 400 mg  . lactated ringers infusion 1,000 mL  .  midazolam (VERSED) 5 MG/5ML injection 1-2 mg    Make sure Flumazenil is available in the pyxis when using this medication. If oversedation occurs, administer 0.2 mg IV over 15 sec. If after 45 sec no response, administer 0.2 mg again over 1 min; may repeat at 1 min intervals; not to exceed 4 doses (1 mg)  . fentaNYL (SUBLIMAZE) injection 25-50 mcg    Make sure Narcan is available in the pyxis when using this medication. In the event of respiratory depression (RR< 8/min): Titrate NARCAN (naloxone) in increments of 0.1 to 0.2 mg IV at 2-3 minute intervals, until desired degree of reversal.  . ropivacaine (PF) 2 mg/mL (0.2%) (NAROPIN) injection 9 mL  . methylPREDNISolone acetate (DEPO-MEDROL) injection 80 mg   Medications administered: We administered lidocaine, lactated ringers, midazolam, fentaNYL, ropivacaine (PF) 2 mg/mL (0.2%), and methylPREDNISolone acetate.  See the medical record for exact dosing, route, and time of administration.  Follow-up plan:   Return in about 2 weeks (around 10/22/2020) for (F2F), (PPE).       Interventional Therapies  Risk  Complexity Considerations:   WNL   Planned  Pending:   Diagnostic bilateral SI joint block #1    Under consideration:   Therapeutic bilateral lumbar facet RFA #1, starting with the left side. Diagnostic bilateral SI joint block #1  Possible bilateral SI joint RFA  Diagnostic left IA hip joint injection    Completed:   Diagnostic bilateral lumbar facet block x2 (05/23/2020)    Therapeutic  Palliative (PRN) options:   Palliative bilateral lumbar facet block #3     Recent Visits No visits were found meeting these conditions. Showing recent visits within past 90  days and meeting all other requirements Today's Visits Date Type Provider Dept  10/08/20 Procedure visit Milinda Pointer, MD [facility]  Showing today's visits and meeting all other requirements Future Appointments Date Type Provider Dept  10/24/20  Appointment Milinda Pointer, MD [facility]  Showing future appointments within next 90 days and meeting all other requirements  Disposition: Discharge home  Discharge (Date  Time): 10/08/2020; 1127 hrs.   Primary Care Physician: Ferdie Ping, MD Location: [facility] Note by: Gaspar Cola, MD Date: 10/08/2020; Time: 1:57 PM  Disclaimer:  Medicine is not an Chief Strategy Officer. The only guarantee in medicine is that nothing is guaranteed. It is important to note that the decision to proceed with this intervention was based on the information collected from the patient. The Data and conclusions were drawn from the patient's questionnaire, the interview, and the physical examination. Because the information was provided in large part by the patient, it cannot be guaranteed that it has not been purposely or unconsciously manipulated. Every effort has been made to obtain as much relevant data as possible for this evaluation. It is important to note that the conclusions that lead to this procedure are derived in large part from the available data. Always take into account that the treatment will also be dependent on availability of resources and existing treatment guidelines, considered by other Pain Management Practitioners as being common knowledge and practice, at the time of the intervention. For Medico-Legal purposes, it is also important to point out that variation in procedural techniques and pharmacological choices are the acceptable norm. The indications, contraindications, technique, and results of the above procedure should only be interpreted and judged by a Board-Certified Interventional Pain Specialist with extensive familiarity and expertise in the same exact procedure and technique.

## 2020-10-09 ENCOUNTER — Telehealth: Payer: Self-pay | Admitting: *Deleted

## 2020-10-09 NOTE — Telephone Encounter (Signed)
Denies any post procedure issues. 

## 2020-10-24 ENCOUNTER — Ambulatory Visit: Payer: No Typology Code available for payment source | Attending: Pain Medicine | Admitting: Pain Medicine

## 2020-10-24 ENCOUNTER — Other Ambulatory Visit: Payer: Self-pay

## 2020-10-24 ENCOUNTER — Encounter: Payer: Self-pay | Admitting: Pain Medicine

## 2020-10-24 VITALS — BP 136/68 | HR 65 | Temp 97.3°F | Resp 16 | Ht 70.0 in | Wt 170.0 lb

## 2020-10-24 DIAGNOSIS — G894 Chronic pain syndrome: Secondary | ICD-10-CM | POA: Insufficient documentation

## 2020-10-24 DIAGNOSIS — G8929 Other chronic pain: Secondary | ICD-10-CM | POA: Insufficient documentation

## 2020-10-24 DIAGNOSIS — M545 Low back pain, unspecified: Secondary | ICD-10-CM | POA: Insufficient documentation

## 2020-10-24 DIAGNOSIS — M47816 Spondylosis without myelopathy or radiculopathy, lumbar region: Secondary | ICD-10-CM | POA: Diagnosis not present

## 2020-10-24 DIAGNOSIS — M533 Sacrococcygeal disorders, not elsewhere classified: Secondary | ICD-10-CM | POA: Insufficient documentation

## 2020-10-24 DIAGNOSIS — Z79899 Other long term (current) drug therapy: Secondary | ICD-10-CM | POA: Insufficient documentation

## 2020-10-24 DIAGNOSIS — M779 Enthesopathy, unspecified: Secondary | ICD-10-CM | POA: Insufficient documentation

## 2020-10-24 NOTE — Progress Notes (Signed)
PROVIDER NOTE: Information contained herein reflects review and annotations entered in association with encounter. Interpretation of such information and data should be left to medically-trained personnel. Information provided to patient can be located elsewhere in the medical record under "Patient Instructions". Document created using STT-dictation technology, any transcriptional errors that may result from process are unintentional.    Patient: Albert Gordon  Service Category: E/M  Provider: Gaspar Cola, MD  DOB: [birth year]  DOS: 10/24/2020  Specialty: Interventional Pain Management  MRN: [id number]  Setting: Ambulatory outpatient  PCP: Albert Ping, MD  Type: Established Patient    Referring Provider: Ferdie Ping, MD  Location: Office  Delivery: Face-to-face     HPI  [name], a 75 y.o. 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 [name] primary complain today is Back Pain Last encounter: My last encounter with him was on 10/08/2020. Pertinent problems: [name] has Chronic pain syndrome; Chronic low back pain (1ry area of Pain) (Bilateral) (L>R) w/o sciatica; Lumbar facet syndrome (Bilateral) (L>R); Chronic lower extremity pain (Bilateral) (L>R); Chronic hip pain (Left); Chronic sacroiliac joint pain (Bilateral) (L>R); Somatic dysfunction of sacroiliac joints (Bilateral); Spondylosis without myelopathy or radiculopathy, lumbosacral region; Lumbar facet arthropathy (Multilevel) (Bilateral); Lumbar facet hypertrophy (Multilevel) (Bilateral); DDD (degenerative disc disease), lumbar; Degenerative joint disease (DJD) of lumbar spine; Osteoarthritis of facet joint of lumbar spine; Osteoarthritis of lumbar spine; 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 Pain Assessment: Severity of Chronic pain is reported as a 4 /10. Location: Back Left,Right/pain radiaties down both leg to the back of his knee. Onset: More than a month ago. Quality: Aching,Constant,Discomfort,Radiating,Tightness. Timing: Constant. Modifying factor(s): meds, procedure, heating pad. Vitals:  height is '5\' 10"'  (1.778 m) and weight is 170 lb (77.1 kg). His temporal temperature is 97.3 F (36.3 C) (abnormal). His blood pressure is 136/68 and his pulse is 65. His respiration is 16 and oxygen saturation is 98%.   Reason for encounter: post-procedure assessment.  The patient returns to the clinic today after having had a diagnostic bilateral SI joint block #1 under fluoroscopic guidance and IV sedation done.  This provided the patient with 100% relief of the pain for the duration of the local anesthetic that actually lasted for approximately 3 to 4 days.  After that the pain started coming back and currently it is an ongoing 50% benefit.  Interestingly, he refers that he still have a partial of the lower back that did not get better with the injection suggesting that were dealing with both, the lumbar facet syndrome and the sacroiliac joint pain.  For this reason, I have proposed to proceed with radiofrequency ablation of both areas in order to get a more complete relief of the pain.  Post-Procedure Evaluation  Procedure (10/08/2020): Diagnostic bilateral SI joint block #1 under fluoroscopic guidance and IV sedation Pre-procedure pain level: 7/10 Post-procedure: 0/10 (100% relief)  Sedation: Sedation provided.  Effectiveness during initial hour after procedure(Ultra-Short Term Relief): 100 %.  Local anesthetic used: Long-acting (4-6 hours) Effectiveness: Defined as any analgesic benefit obtained secondary to the administration of local anesthetics. This carries significant diagnostic value as to the etiological location, or  anatomical origin, of the pain. Duration of benefit is expected to coincide with the duration of  the local anesthetic used.  Effectiveness during initial 4-6 hours after procedure(Short-Term Relief): 100 %.  Long-term benefit: Defined as any relief past the pharmacologic duration of the local anesthetics.  Effectiveness past the initial 6 hours after procedure(Long-Term Relief): 100 % (lasted for 3 to 4 days,).  Current benefits: Defined as benefit that persist at this time.   Analgesia:  50% improved Function: [name] reports improvement in function ROM: [name] reports improvement in ROM  Pharmacotherapy Assessment   Analgesic: No opioid analgesics prescribed by our practice. Morphine ER 15 mg daily (15 MME) + hydrocodone/APAP 10/325 1 tablet every 8 hours (45 MME/day) written by the "[facility] [employer] [facility]" MME/day: 45 mg/day   Monitoring:  PMP: PDMP reviewed during this encounter.       Pharmacotherapy: No side-effects or adverse reactions reported. Compliance: No problems identified. Effectiveness: Clinically acceptable.  Albert Fischer, RN  10/24/2020  2:22 PM  Sign when Signing Visit Safety precautions to be maintained throughout the outpatient stay will include: orient to surroundings, keep bed in low position, maintain call bell within reach at all times, provide assistance with transfer out of bed and ambulation.     UDS:  Summary  Date Value Ref Range Status  01/29/2020 Note  Final    Comment:    ==================================================================== Compliance Drug Analysis, Ur ==================================================================== Test                             Result       Flag       Units  Drug Present and Declared for Prescription Verification   Morphine                       3126         EXPECTED   ng/mg creat   Normorphine                    100          EXPECTED   ng/mg creat    Potential sources of morphine include  administration of codeine or    morphine, use of heroin, or ingestion of poppy seeds.     Normorphine is an expected metabolite of morphine.    Hydrocodone                    187          EXPECTED   ng/mg creat   Dihydrocodeine                 169          EXPECTED   ng/mg creat   Norhydrocodone                 3061         EXPECTED   ng/mg creat    Sources of hydrocodone include scheduled prescription medications.    Dihydrocodeine and norhydrocodone are expected metabolites of    hydrocodone. Dihydrocodeine is also available as a scheduled    prescription medication.    Acetaminophen                  PRESENT      EXPECTED ==================================================================== Test                      Result    Flag   Units      Ref  Range   Creatinine              61               mg/dL      >=20 ==================================================================== Declared Medications:  The flagging and interpretation on this report are based on the  following declared medications.  Unexpected results may arise from  inaccuracies in the declared medications.   **Note: The testing scope of this panel includes these medications:   Hydrocodone (Norco)  Morphine (MSIR)   **Note: The testing scope of this panel does not include small to  moderate amounts of these reported medications:   Acetaminophen (Norco)   **Note: The testing scope of this panel does not include the  following reported medications:   Atorvastatin (Lipitor)  Hydrochlorothiazide (Maxzide)  Tamsulosin (Flomax)  Terazosin (Hytrin)  Triamterene (Maxzide) ==================================================================== For clinical consultation, please call [phone]. ====================================================================      ROS  Constitutional: Denies any fever or chills Gastrointestinal: No reported hemesis, hematochezia, vomiting, or acute GI  distress Musculoskeletal: Denies any acute onset joint swelling, redness, loss of ROM, or weakness Neurological: No reported episodes of acute onset apraxia, aphasia, dysarthria, agnosia, amnesia, paralysis, loss of coordination, or loss of consciousness  Medication Review  HYDROcodone-acetaminophen, cyclobenzaprine, morphine, salicyclic acid-sulfur, tamsulosin, terazosin, and triamterene-hydrochlorothiazide  History Review  Allergy: [name] has No Known Allergies. Drug: [name]  reports previous drug use. Alcohol:  reports current alcohol use. Tobacco:  reports that he has quit smoking. He has quit using smokeless tobacco. Social: [name]  reports that he has quit smoking. He has quit using smokeless tobacco. He reports current alcohol use. He reports previous drug use. Medical:  has a past medical history of Hypertension. Surgical: [name]  has a past surgical history that includes Hernia repair. Family: family history is not on file.  Laboratory Chemistry Profile   Renal Lab Results  Component Value Date   BUN 15 01/29/2020   CREATININE 0.93 01/29/2020   BCR 16 01/29/2020   GFRAA 93 01/29/2020   GFRNONAA 80 01/29/2020     Hepatic Lab Results  Component Value Date   AST 18 01/29/2020   ALBUMIN 4.4 01/29/2020   ALKPHOS 50 01/29/2020     Electrolytes Lab Results  Component Value Date   NA 142 01/29/2020   K 4.8 01/29/2020   CL 102 01/29/2020   CALCIUM 9.4 01/29/2020   MG 2.3 01/29/2020     Bone Lab Results  Component Value Date   25OHVITD1 54 01/29/2020   25OHVITD2 <1.0 01/29/2020   25OHVITD3 54 01/29/2020     Inflammation (CRP: Acute Phase) (ESR: Chronic Phase) Lab Results  Component Value Date   CRP <1 01/29/2020   ESRSEDRATE 2 01/29/2020       Note: Above Lab results reviewed.  Recent Imaging Review  DG PAIN CLINIC C-ARM 1-60 MIN NO REPORT Fluoro was used, but no Radiologist interpretation will be provided.  Please refer to "NOTES" tab for  provider progress note. Note: Reviewed        Physical Exam  General appearance: Well nourished, well developed, and well hydrated. In no apparent acute distress Mental status: Alert, oriented x 3 (person, place, & time)       Respiratory: No evidence of acute respiratory distress Eyes: PERLA Vitals: BP 136/68 (BP Location: Left Arm, Patient Position: Sitting, Cuff Size: Normal)   Pulse 65   Temp (!) 97.3 F (36.3 C) (Temporal)   Resp 16  Ht '5\' 10"'  (1.778 m)   Wt 170 lb (77.1 kg)   SpO2 98%   BMI 24.39 kg/m  BMI: Estimated body mass index is 24.39 kg/m as calculated from the following:   Height as of this encounter: '5\' 10"'  (1.778 m).   Weight as of this encounter: 170 lb (77.1 kg). Ideal: Ideal body weight: 73 kg (160 lb 15 oz) Adjusted ideal body weight: 74.6 kg (164 lb 9 oz)  Assessment   Status Diagnosis  Controlled Controlled Controlled 1. Chronic sacroiliac joint pain (Bilateral) (L>R)   2. Enthesopathy of sacroiliac joint (Bilateral)   3. Chronic low back pain (1ry area of Pain) (Bilateral) (L>R) w/o sciatica   4. Lumbar facet syndrome (Bilateral) (L>R)   5. Chronic pain syndrome   6. Pharmacologic therapy      Updated Problems: No problems updated.  Plan of Care  Problem-specific:  No problem-specific Assessment & Plan notes found for this encounter.  [name] has a current medication list which includes the following long-term medication(s): terazosin, triamterene-hydrochlorothiazide, and cyclobenzaprine.  Pharmacotherapy (Medications Ordered): No orders of the defined types were placed in this encounter.  Orders:  Orders Placed This Encounter  Procedures  . Radiofrequency,Lumbar    Standing Status:   Future    Standing Expiration Date:   10/24/2021    Scheduling Instructions:     Side(s): Left-sided     Level: L3-4, L4-5, & L5-S1 Facets (L2, L3, L4, L5, & S1 Medial Branch Nerves)     Sedation: With Sedation.     Scheduling Timeframe: As  soon as pre-approved    Order Specific Question:   Where will this procedure be performed?    Answer:   [facility]  . Radiofrequency Sacroiliac Joint    Standing Status:   Future    Standing Expiration Date:   12/24/2020    Scheduling Instructions:     Side(s): Left-sided     Level(s): L4, L5, S1, S2, & S3 Medial Branch Nerve(s)     Sedation: With Sedation.     Scheduling Timeframe: As soon as pre-approved     Procedure: Sacroiliac joint RFA   Follow-up plan:   Return for RFA (63mn): (L) L-FCT + SI RFA #1.      Interventional Therapies  Risk  Complexity Considerations:   WNL   Planned  Pending:   Therapeutic left lumbar facet + left SI RFA #1    Under consideration:   Therapeutic bilateral lumbar facet RFA #1, starting with the left side. Possible bilateral SI joint RFA #1 starting with the left side. Diagnostic left IA hip joint injection    Completed:   Diagnostic bilateral lumbar facet block x2 (05/23/2020) (100/100/70/>50)  Diagnostic bilateral SI joint block x1 (10/08/2020) (100/100/100 x3 to 4 days/50)    Therapeutic  Palliative (PRN) options:   Palliative bilateral lumbar facet block #3  Palliative bilateral SI joint block #2     Recent Visits Date Type Provider Dept  10/08/20 Procedure visit NMilinda Pointer MD [facility]  Showing recent visits within past 90 days and meeting all other requirements Today's Visits Date Type Provider Dept  10/24/20 Office Visit NMilinda Pointer MD [facility]  Showing today's visits and meeting all other requirements Future Appointments Date Type Provider Dept  11/21/20 Appointment NMilinda Pointer MD [facility]  Showing future appointments within next 90 days and meeting all other requirements  I discussed the assessment and treatment plan with the patient. The  patient was provided an opportunity to ask questions and all were answered. The patient agreed with the plan and  demonstrated an understanding of the instructions.  Patient advised to call back or seek an in-person evaluation if the symptoms or condition worsens.  Duration of encounter: 30 minutes.  Note by: Albert Cola, MD Date: 10/24/2020; Time: 4:57 PM

## 2020-10-24 NOTE — Patient Instructions (Signed)
____________________________________________________________________________________________  Preparing for Procedure with Sedation  Procedure appointments are limited to planned procedures: . No Prescription Refills. . No disability issues will be discussed. . No medication changes will be discussed.  Instructions: . Oral Intake: Do not eat or drink anything for at least 8 hours prior to your procedure. (Exception: Blood Pressure Medication. See below.) . Transportation: Unless otherwise stated by your physician, you may drive yourself after the procedure. . Blood Pressure Medicine: Do not forget to take your blood pressure medicine with a sip of water the morning of the procedure. If your Diastolic (lower reading)is above 100 mmHg, elective cases will be cancelled/rescheduled. . Blood thinners: These will need to be stopped for procedures. Notify our staff if you are taking any blood thinners. Depending on which one you take, there will be specific instructions on how and when to stop it. . Diabetics on insulin: Notify the staff so that you can be scheduled 1st case in the morning. If your diabetes requires high dose insulin, take only  of your normal insulin dose the morning of the procedure and notify the staff that you have done so. . Preventing infections: Shower with an antibacterial soap the morning of your procedure. . Build-up your immune system: Take 1000 mg of Vitamin C with every meal (3 times a day) the day prior to your procedure. . Antibiotics: Inform the staff if you have a condition or reason that requires you to take antibiotics before dental procedures. . Pregnancy: If you are pregnant, call and cancel the procedure. . Sickness: If you have a cold, fever, or any active infections, call and cancel the procedure. . Arrival: You must be in the facility at least 30 minutes prior to your scheduled procedure. . Children: Do not bring children with you. . Dress appropriately:  Bring dark clothing that you would not mind if they get stained. . Valuables: Do not bring any jewelry or valuables.  Reasons to call and reschedule or cancel your procedure: (Following these recommendations will minimize the risk of a serious complication.) . Surgeries: Avoid having procedures within 2 weeks of any surgery. (Avoid for 2 weeks before or after any surgery). . Flu Shots: Avoid having procedures within 2 weeks of a flu shots or . (Avoid for 2 weeks before or after immunizations). . Barium: Avoid having a procedure within 7-10 days after having had a radiological study involving the use of radiological contrast. (Myelograms, Barium swallow or enema study). . Heart attacks: Avoid any elective procedures or surgeries for the initial 6 months after a "Myocardial Infarction" (Heart Attack). . Blood thinners: It is imperative that you stop these medications before procedures. Let us know if you if you take any blood thinner.  . Infection: Avoid procedures during or within two weeks of an infection (including chest colds or gastrointestinal problems). Symptoms associated with infections include: Localized redness, fever, chills, night sweats or profuse sweating, burning sensation when voiding, cough, congestion, stuffiness, runny nose, sore throat, diarrhea, nausea, vomiting, cold or Flu symptoms, recent or current infections. It is specially important if the infection is over the area that we intend to treat. . Heart and lung problems: Symptoms that may suggest an active cardiopulmonary problem include: cough, chest pain, breathing difficulties or shortness of breath, dizziness, ankle swelling, uncontrolled high or unusually low blood pressure, and/or palpitations. If you are experiencing any of these symptoms, cancel your procedure and contact your primary care physician for an evaluation.  Remember:  Regular Business hours are:    Monday to Thursday 8:00 AM to 4:00 PM  Provider's  Schedule: Kattia Selley, MD:  Procedure days: Tuesday and Thursday 7:30 AM to 4:00 PM  Bilal Lateef, MD:  Procedure days: Monday and Wednesday 7:30 AM to 4:00 PM ____________________________________________________________________________________________   ____________________________________________________________________________________________  General Risks and Possible Complications  Patient Responsibilities: It is important that you read this as it is part of your informed consent. It is our duty to inform you of the risks and possible complications associated with treatments offered to you. It is your responsibility as a patient to read this and to ask questions about anything that is not clear or that you believe was not covered in this document.  Patient's Rights: You have the right to refuse treatment. You also have the right to change your mind, even after initially having agreed to have the treatment done. However, under this last option, if you wait until the last second to change your mind, you may be charged for the materials used up to that point.  Introduction: Medicine is not an exact science. Everything in Medicine, including the lack of treatment(s), carries the potential for danger, harm, or loss (which is by definition: Risk). In Medicine, a complication is a secondary problem, condition, or disease that can aggravate an already existing one. All treatments carry the risk of possible complications. The fact that a side effects or complications occurs, does not imply that the treatment was conducted incorrectly. It must be clearly understood that these can happen even when everything is done following the highest safety standards.  No treatment: You can choose not to proceed with the proposed treatment alternative. The "PRO(s)" would include: avoiding the risk of complications associated with the therapy. The "CON(s)" would include: not getting any of the treatment  benefits. These benefits fall under one of three categories: diagnostic; therapeutic; and/or palliative. Diagnostic benefits include: getting information which can ultimately lead to improvement of the disease or symptom(s). Therapeutic benefits are those associated with the successful treatment of the disease. Finally, palliative benefits are those related to the decrease of the primary symptoms, without necessarily curing the condition (example: decreasing the pain from a flare-up of a chronic condition, such as incurable terminal cancer).  General Risks and Complications: These are associated to most interventional treatments. They can occur alone, or in combination. They fall under one of the following six (6) categories: no benefit or worsening of symptoms; bleeding; infection; nerve damage; allergic reactions; and/or death. 1. No benefits or worsening of symptoms: In Medicine there are no guarantees, only probabilities. No healthcare provider can ever guarantee that a medical treatment will work, they can only state the probability that it may. Furthermore, there is always the possibility that the condition may worsen, either directly, or indirectly, as a consequence of the treatment. 2. Bleeding: This is more common if the patient is taking a blood thinner, either prescription or over the counter (example: Goody Powders, Fish oil, Aspirin, Garlic, etc.), or if suffering a condition associated with impaired coagulation (example: Hemophilia, cirrhosis of the liver, low platelet counts, etc.). However, even if you do not have one on these, it can still happen. If you have any of these conditions, or take one of these drugs, make sure to notify your treating physician. 3. Infection: This is more common in patients with a compromised immune system, either due to disease (example: diabetes, cancer, human immunodeficiency virus [HIV], etc.), or due to medications or treatments (example: therapies used to treat  cancer and   rheumatological diseases). However, even if you do not have one on these, it can still happen. If you have any of these conditions, or take one of these drugs, make sure to notify your treating physician. 4. Nerve Damage: This is more common when the treatment is an invasive one, but it can also happen with the use of medications, such as those used in the treatment of cancer. The damage can occur to small secondary nerves, or to large primary ones, such as those in the spinal cord and brain. This damage may be temporary or permanent and it may lead to impairments that can range from temporary numbness to permanent paralysis and/or brain death. 5. Allergic Reactions: Any time a substance or material comes in contact with our body, there is the possibility of an allergic reaction. These can range from a mild skin rash (contact dermatitis) to a severe systemic reaction (anaphylactic reaction), which can result in death. 6. Death: In general, any medical intervention can result in death, most of the time due to an unforeseen complication. ____________________________________________________________________________________________   

## 2020-10-24 NOTE — Progress Notes (Signed)
Safety precautions to be maintained throughout the outpatient stay will include: orient to surroundings, keep bed in low position, maintain call bell within reach at all times, provide assistance with transfer out of bed and ambulation.  

## 2020-11-21 ENCOUNTER — Other Ambulatory Visit: Payer: Self-pay

## 2020-11-21 ENCOUNTER — Encounter: Payer: Self-pay | Admitting: Pain Medicine

## 2020-11-21 ENCOUNTER — Ambulatory Visit (HOSPITAL_BASED_OUTPATIENT_CLINIC_OR_DEPARTMENT_OTHER): Payer: No Typology Code available for payment source | Admitting: Pain Medicine

## 2020-11-21 ENCOUNTER — Ambulatory Visit
Admission: RE | Admit: 2020-11-21 | Discharge: 2020-11-21 | Disposition: A | Discharge status: Home / Self Care | Payer: No Typology Code available for payment source | Source: Ambulatory Visit | Attending: Pain Medicine | Admitting: Pain Medicine

## 2020-11-21 VITALS — BP 114/70 | HR 68 | Temp 97.0°F | Resp 12 | Ht 70.0 in | Wt 170.0 lb

## 2020-11-21 DIAGNOSIS — G8929 Other chronic pain: Secondary | ICD-10-CM | POA: Diagnosis present

## 2020-11-21 DIAGNOSIS — M5386 Other specified dorsopathies, lumbar region: Secondary | ICD-10-CM | POA: Diagnosis not present

## 2020-11-21 DIAGNOSIS — M5136 Other intervertebral disc degeneration, lumbar region: Secondary | ICD-10-CM | POA: Diagnosis not present

## 2020-11-21 DIAGNOSIS — G8918 Other acute postprocedural pain: Secondary | ICD-10-CM | POA: Insufficient documentation

## 2020-11-21 DIAGNOSIS — M545 Low back pain, unspecified: Secondary | ICD-10-CM

## 2020-11-21 DIAGNOSIS — M47898 Other spondylosis, sacral and sacrococcygeal region: Secondary | ICD-10-CM | POA: Diagnosis not present

## 2020-11-21 DIAGNOSIS — M779 Enthesopathy, unspecified: Secondary | ICD-10-CM | POA: Insufficient documentation

## 2020-11-21 DIAGNOSIS — M9904 Segmental and somatic dysfunction of sacral region: Secondary | ICD-10-CM | POA: Diagnosis present

## 2020-11-21 DIAGNOSIS — M47816 Spondylosis without myelopathy or radiculopathy, lumbar region: Secondary | ICD-10-CM

## 2020-11-21 DIAGNOSIS — M533 Sacrococcygeal disorders, not elsewhere classified: Secondary | ICD-10-CM | POA: Insufficient documentation

## 2020-11-21 DIAGNOSIS — M47817 Spondylosis without myelopathy or radiculopathy, lumbosacral region: Secondary | ICD-10-CM | POA: Diagnosis present

## 2020-11-21 MED ORDER — LIDOCAINE HCL 2 % IJ SOLN
20.0000 mL | Freq: Once | INTRAMUSCULAR | Status: AC
  Administered 2020-11-21: 400 mg

## 2020-11-21 MED ORDER — MIDAZOLAM HCL 5 MG/5ML IJ SOLN: 1.0000 mg | INTRAMUSCULAR | Status: DC | PRN

## 2020-11-21 MED ORDER — LIDOCAINE HCL 2 % IJ SOLN
INTRAMUSCULAR | Status: AC
  Filled 2020-11-21: qty 20

## 2020-11-21 MED ORDER — TRIAMCINOLONE ACETONIDE 40 MG/ML IJ SUSP
INTRAMUSCULAR | Status: AC
  Filled 2020-11-21: qty 2

## 2020-11-21 MED ORDER — FENTANYL CITRATE (PF) 100 MCG/2ML IJ SOLN: 25.0000 ug | INTRAMUSCULAR | Status: DC | PRN

## 2020-11-21 MED ORDER — ROPIVACAINE HCL 2 MG/ML IJ SOLN
INTRAMUSCULAR | Status: AC
  Filled 2020-11-21: qty 20

## 2020-11-21 MED ORDER — TRIAMCINOLONE ACETONIDE 40 MG/ML IJ SUSP
80.0000 mg | Freq: Once | INTRAMUSCULAR | Status: AC
  Administered 2020-11-21: 80 mg

## 2020-11-21 MED ORDER — ROPIVACAINE HCL 2 MG/ML IJ SOLN
18.0000 mL | Freq: Once | INTRAMUSCULAR | Status: AC
  Administered 2020-11-21: 18 mL via PERINEURAL

## 2020-11-21 NOTE — Progress Notes (Signed)
PROVIDER NOTE: Information contained herein reflects review and annotations entered in association with encounter. Interpretation of such information and data should be left to medically-trained personnel. Information provided to patient can be located elsewhere in the medical record under "Patient Instructions". Document created using STT-dictation technology, any transcriptional errors that may result from process are unintentional.    Patient: Albert Gordon  Service Category: Procedure  Provider: Gaspar Cola, MD  DOB: 12-06-1945  DOS: 11/21/2020  Location: [facility]  MRN: [id number]  Setting: Ambulatory - outpatient  Referring Provider: Ferdie Ping, MD  Type: Established Patient  Specialty: Interventional Pain Management  PCP: Ferdie Ping, MD   Primary Reason for Visit: Interventional Pain Management Treatment. CC: Back Pain  Procedure:          Anesthesia, Analgesia, Anxiolysis:  Type: Thermal Lumbar Facet, Medial Branch & Sacroiliac joint Radiofrequency Ablation  #1  Region: Lumbosacral Level: L2, L3, L4, L5, S1, S2, & S3 Medial Branch Level(s). These levels will denervate the L3-4, L4-5, and the L5-S1 lumbar facet joints, as well as the posterior Sacroiliac Joint innervation. Primary Purpose: Therapeutic Region: Posterolateral Lumbosacral Spine Laterality: Left  Type: Local Anesthesia Indication(s): Analgesia         Route: Infiltration ([place]/IM) IV Access: Declined Sedation: Declined  Local Anesthetic: Lidocaine 1-2%  Position: Prone   Indications: 1. Lumbar facet syndrome (Bilateral) (L>R)   2. Lumbar facet hypertrophy (Multilevel) (Bilateral)   3. Spondylosis without myelopathy or radiculopathy, lumbosacral region   4. Chronic sacroiliac joint pain (Left)   5. Somatic dysfunction of sacroiliac joints (Bilateral)   6. Enthesopathy of sacroiliac joint (Bilateral)   7. Other spondylosis, sacral and sacrococcygeal region   8. Degenerative joint  disease (DJD) of lumbar spine   9. Decreased range of motion of lumbar spine   10. DDD (degenerative disc disease), lumbar   11. Chronic low back pain (1ry area of Pain) (Bilateral) (L>R) w/o sciatica    Albert Gordon has been dealing with the above chronic pain for longer than three months and has either failed to respond, was unable to tolerate, or simply did not get enough benefit from other more conservative therapies including, but not limited to: 1. Over-the-counter medications 2. Anti-inflammatory medications 3. Muscle relaxants 4. Membrane stabilizers 5. Opioids 6. Physical therapy and/or chiropractic manipulation 7. Modalities (Heat, ice, etc.) 8. Invasive techniques such as nerve blocks. Albert Gordon has attained more than 50% relief of the pain from a series of diagnostic injections conducted in separate occasions.  Pain Score: Pre-procedure: 5 /10 Post-procedure: 0-No pain/10  Pre-op H&P Assessment:  Albert Gordon is a 75 y.o. (year old), male patient, seen today for interventional treatment. He  has a past surgical history that includes Hernia repair. Albert Gordon has a current medication list which includes the following prescription(s): atorvastatin, hydrocodone-acetaminophen, morphine, salicyclic acid-sulfur, tamsulosin, terazosin, triamterene-hydrochlorothiazide, and cyclobenzaprine. His primarily concern today is the Back Pain  Initial Vital Signs:  Pulse/HCG Rate: 68ECG Heart Rate: 62 Temp: (!) 97 F (36.1 C) Resp: 16 BP: 130/66 SpO2: 96 %  BMI: Estimated body mass index is 24.39 kg/m as calculated from the following:   Height as of this encounter: 5\' 10"  (1.778 m).   Weight as of this encounter: 170 lb (77.1 kg).  Risk Assessment: Allergies: Reviewed. He has No Known Allergies.  Allergy Precautions: None required Coagulopathies: Reviewed. None identified.  Blood-thinner therapy: None at this time Active Infection(s): Reviewed. None identified. Albert Gordon is  afebrile  Site Confirmation: Albert Gordon was asked to confirm the procedure and laterality before marking the site Procedure checklist: Completed Consent: Before the procedure and under the influence of no sedative(s), amnesic(s), or anxiolytics, the patient was informed of the treatment options, risks and possible complications. To fulfill our ethical and legal obligations, as recommended by the American Medical Association's Code of Ethics, I have informed the patient of my clinical impression; the nature and purpose of the treatment or procedure; the risks, benefits, and possible complications of the intervention; the alternatives, including doing nothing; the risk(s) and benefit(s) of the alternative treatment(s) or procedure(s); and the risk(s) and benefit(s) of doing nothing. The patient was provided information about the general risks and possible complications associated with the procedure. These may include, but are not limited to: failure to achieve desired goals, infection, bleeding, organ or nerve damage, allergic reactions, paralysis, and death. In addition, the patient was informed of those risks and complications associated to Spine-related procedures, such as failure to decrease pain; infection (i.e.: Meningitis, epidural or intraspinal abscess); bleeding (i.e.: epidural hematoma, subarachnoid hemorrhage, or any other type of intraspinal or peri-dural bleeding); organ or nerve damage (i.e.: Any type of peripheral nerve, nerve root, or spinal cord injury) with subsequent damage to sensory, motor, and/or autonomic systems, resulting in permanent pain, numbness, and/or weakness of one or several areas of the body; allergic reactions; (i.e.: anaphylactic reaction); and/or death. Furthermore, the patient was informed of those risks and complications associated with the medications. These include, but are not limited to: allergic reactions (i.e.: anaphylactic or anaphylactoid reaction(s)); adrenal  axis suppression; blood sugar elevation that in diabetics may result in ketoacidosis or comma; water retention that in patients with history of congestive heart failure may result in shortness of breath, pulmonary edema, and decompensation with resultant heart failure; weight gain; swelling or edema; medication-induced neural toxicity; particulate matter embolism and blood vessel occlusion with resultant organ, and/or nervous system infarction; and/or aseptic necrosis of one or more joints. Finally, the patient was informed that Medicine is not an exact science; therefore, there is also the possibility of unforeseen or unpredictable risks and/or possible complications that may result in a catastrophic outcome. The patient indicated having understood very clearly. We have given the patient no guarantees and we have made no promises. Enough time was given to the patient to ask questions, all of which were answered to the patient's satisfaction. Mr. Altamirano has indicated that he wanted to continue with the procedure. Attestation: I, the ordering provider, attest that I have discussed with the patient the benefits, risks, side-effects, alternatives, likelihood of achieving goals, and potential problems during recovery for the procedure that I have provided informed consent. Date  Time: 11/21/2020 10:05 AM  Pre-Procedure Preparation:  Monitoring: As per clinic protocol. Respiration, ETCO2, SpO2, BP, heart rate and rhythm monitor placed and checked for adequate function Safety Precautions: Patient was assessed for positional comfort and pressure points before starting the procedure. Time-out: I initiated and conducted the "Time-out" before starting the procedure, as per protocol. The patient was asked to participate by confirming the accuracy of the "Time Out" information. Verification of the correct person, site, and procedure were performed and confirmed by me, the nursing staff, and the patient. "Time-out"  conducted as per Joint Commission's Universal Protocol (UP.01.01.01). Time: 1056  Description of Procedure:          Laterality: Left Level: L2, L3, L4, L5, S1, S2, & S3 Medial Branch Level(s), at the L3-4, L4-5, and the  L5-S1 lumbar facet joints, as well as the posterior Sacroiliac Joint. Area Prepped: Lumbosacral DuraPrep (Iodine Povacrylex [0.7% available iodine] and Isopropyl Alcohol, 74% w/w) Safety Precautions: Aspiration looking for blood return was conducted prior to all injections. At no point did we inject any substances, as a needle was being advanced. Before injecting, the patient was told to immediately notify me if he was experiencing any new onset of "ringing in the ears, or metallic taste in the mouth". No attempts were made at seeking any paresthesias. Safe injection practices and needle disposal techniques used. Medications properly checked for expiration dates. SDV (single dose vial) medications used. After the completion of the procedure, all disposable equipment used was discarded in the proper designated medical waste containers. Local Anesthesia: Protocol guidelines were followed. The patient was positioned over the fluoroscopy table. The area was prepped in the usual manner. The time-out was completed. The target area was identified using fluoroscopy. A 12-in long, straight, sterile hemostat was used with fluoroscopic guidance to locate the targets for each level blocked. Once located, the skin was marked with an approved surgical skin marker. Once all sites were marked, the skin (epidermis, dermis, and hypodermis), as well as deeper tissues (fat, connective tissue and muscle) were infiltrated with a small amount of a short-acting local anesthetic, loaded on a 10cc syringe with a 25G, 1.5-in  Needle. An appropriate amount of time was allowed for local anesthetics to take effect before proceeding to the next step. Local Anesthetic: Lidocaine 2.0% The unused portion of the local  anesthetic was discarded in the proper designated containers. Technical explanation of process:  Radiofrequency Ablation (RFA) L2 Medial Branch Nerve RFA: The target area for the L2 medial branch is at the junction of the postero-lateral aspect of the superior articular process and the superior, posterior, and medial edge of the transverse process of L3. Under fluoroscopic guidance, a Radiofrequency needle was inserted until contact was made with os over the superior postero-lateral aspect of the pedicular shadow (target area). Sensory and motor testing was conducted to properly adjust the position of the needle. Once satisfactory placement of the needle was achieved, the numbing solution was slowly injected after negative aspiration for blood. 2.0 mL of the nerve block solution was injected without difficulty or complication. After waiting for at least 3 minutes, the ablation was performed. Once completed, the needle was removed intact. L3 Medial Branch Nerve RFA: The target area for the L3 medial branch is at the junction of the postero-lateral aspect of the superior articular process and the superior, posterior, and medial edge of the transverse process of L4. Under fluoroscopic guidance, a Radiofrequency needle was inserted until contact was made with os over the superior postero-lateral aspect of the pedicular shadow (target area). Sensory and motor testing was conducted to properly adjust the position of the needle. Once satisfactory placement of the needle was achieved, the numbing solution was slowly injected after negative aspiration for blood. 2.0 mL of the nerve block solution was injected without difficulty or complication. After waiting for at least 3 minutes, the ablation was performed. Once completed, the needle was removed intact. L4 Medial Branch Nerve RFA: The target area for the L4 medial branch is at the junction of the postero-lateral aspect of the superior articular process and the  superior, posterior, and medial edge of the transverse process of L5. Under fluoroscopic guidance, a Radiofrequency needle was inserted until contact was made with os over the superior postero-lateral aspect of the pedicular shadow (target  area). Sensory and motor testing was conducted to properly adjust the position of the needle. Once satisfactory placement of the needle was achieved, the numbing solution was slowly injected after negative aspiration for blood. 2.0 mL of the nerve block solution was injected without difficulty or complication. After waiting for at least 3 minutes, the ablation was performed. Once completed, the needle was removed intact. L5 Medial Branch Nerve RFA: The target area for the L5 medial branch is at the junction of the postero-lateral aspect of the superior articular process of S1 and the superior, posterior, and medial edge of the sacral ala. Under fluoroscopic guidance, a Radiofrequency needle was inserted until contact was made with os over the superior postero-lateral aspect of the pedicular shadow (target area). Sensory and motor testing was conducted to properly adjust the position of the needle. Once satisfactory placement of the needle was achieved, the numbing solution was slowly injected after negative aspiration for blood. 2.0 mL of the nerve block solution was injected without difficulty or complication. After waiting for at least 3 minutes, the ablation was performed. Once completed, the needle was removed intact. S1 Primary Dorsal Rami and Lateral Branch Nerve RFA: The target area for the S1 medial branch is located inferior to the junction of the S1 superior articular process and the L5 inferior articular process, posterior, inferior, and lateral to the 6 o'clock position of the L5-S1 facet joint, just superior to the S1 posterior foramen. Under fluoroscopic guidance, the Radiofrequency needle was advanced until contact was made with os over the Target area. Sensory  and motor testing was conducted to properly adjust the position of the needle. Once satisfactory placement of the needle was achieved, the numbing solution was slowly injected after negative aspiration for blood. 2.0 mL of the nerve block solution was injected without difficulty or complication. After waiting for at least 3 minutes, the ablation was performed. Once completed, the needle was removed intact. S2 Primary Dorsal Rami and Lateral Branch Nerve RFA: The target area for the S2 medial branch is at the posterior superior lateral of the S2 posterior neural foramen. Under fluoroscopic guidance, the Radiofrequency needle was advanced until contact was made with os over the Target area. Sensory and motor testing was conducted to properly adjust the position of the needle. Once satisfactory placement of the needle was achieved, the numbing solution was slowly injected after negative aspiration for blood. 2.0 mL of the nerve block solution was injected without difficulty or complication. After waiting for at least 3 minutes, the ablation was performed. Once completed, the needle was removed intact. S3 Primary Dorsal Rami and Lateral Branch Nerve RFA: The target area for the S3 medial branch is at the posterior superior lateral of the S3 posterior neural foramen. Under fluoroscopic guidance, the Radiofrequency needle was advanced until contact was made with os over the Target area. Sensory and motor testing was conducted to properly adjust the position of the needle. Once satisfactory placement of the needle was achieved, the numbing solution was slowly injected after negative aspiration for blood. 2.0 mL of the nerve block solution was injected without difficulty or complication. After waiting for at least 3 minutes, the ablation was performed. Once completed, the needle was removed intact. Radiofrequency lesioning (ablation):  Radiofrequency Generator: NeuroTherm [id number] Sensory Stimulation Parameters: 50 Hz  was used to locate & identify the nerve, making sure that the needle was positioned such that there was no sensory stimulation below 0.3 V or above 0.7 V. Motor Stimulation Parameters:  2 Hz was used to evaluate the motor component. Care was taken not to lesion any nerves that demonstrated motor stimulation of the lower extremities at an output of less than 2.5 times that of the sensory threshold, or a maximum of 2.0 V. Lesioning Technique Parameters: Standard Radiofrequency settings. (Not bipolar or pulsed.) Temperature Settings: 80 degrees C Lesioning time: 60 seconds Intra-operative Compliance: Compliant Materials & Medications: Needle(s) (Electrode/Cannula) Type: Teflon-coated, curved tip, Radiofrequency needle(s) Gauge: 22G Length: 10cm Numbing solution: 0.2% PF-Ropivacaine + Triamcinolone (40 mg/mL) diluted to a final concentration of 4 mg of Triamcinolone/mL of Ropivacaine The unused portion of the solution was discarded in the proper designated containers.  Once the entire procedure was completed, the treated area was cleaned, making sure to leave some of the prepping solution back to take advantage of its long term bactericidal properties.    Illustration of the posterior view of the lumbar spine and the posterior neural structures. Laminae of L2 through S1 are labeled. DPRL5, dorsal primary ramus of L5; DPRS1, dorsal primary ramus of S1; DPR3, dorsal primary ramus of L3; FJ, facet (zygapophyseal) joint L3-L4; I, inferior articular process of L4; LB1, lateral branch of dorsal primary ramus of L1; IAB, inferior articular branches from L3 medial branch (supplies L4-L5 facet joint); IBP, intermediate branch plexus; MB3, medial branch of dorsal primary ramus of L3; NR3, third lumbar nerve root; S, superior articular process of L5; SAB, superior articular branches from L4 (supplies L4-5 facet joint also); TP3, transverse process of L3.  Vitals:   11/21/20 1114 11/21/20 1119 11/21/20 1125  11/21/20 1130  BP: 125/75 113/73 116/70 114/70  Pulse:      Resp: 10 14 11 12   Temp:      TempSrc:      SpO2: 96% 95% 95% 94%  Weight:      Height:        Start Time: 1056 hrs. End Time: 1130 hrs.  Imaging Guidance (Spinal):          Type of Imaging Technique: Fluoroscopy Guidance (Spinal) Indication(s): Assistance in needle guidance and placement for procedures requiring needle placement in or near specific anatomical locations not easily accessible without such assistance. Exposure Time: Please see nurses notes. Contrast: None used. Fluoroscopic Guidance: I was personally present during the use of fluoroscopy. "Tunnel Vision Technique" used to obtain the best possible view of the target area. Parallax error corrected before commencing the procedure. "Direction-depth-direction" technique used to introduce the needle under continuous pulsed fluoroscopy. Once target was reached, antero-posterior, oblique, and lateral fluoroscopic projection used confirm needle placement in all planes. Images permanently stored in EMR. Interpretation: No contrast injected. I personally interpreted the imaging intraoperatively. Adequate needle placement confirmed in multiple planes. Permanent images saved into the patient's record.  Antibiotic Prophylaxis:   Anti-infectives (From admission, onward)   None     Indication(s): None identified  Post-operative Assessment:  Post-procedure Vital Signs:  Pulse/HCG Rate: 68(!) 53 Temp: (!) 97 F (36.1 C) Resp: 12 BP: 114/70 SpO2: 94 %  EBL: None  Complications: No immediate post-treatment complications observed by team, or reported by patient.  Note: The patient tolerated the entire procedure well. A repeat set of vitals were taken after the procedure and the patient was kept under observation following institutional policy, for this type of procedure. Post-procedural neurological assessment was performed, showing return to baseline, prior to discharge.  The patient was provided with post-procedure discharge instructions, including a section on how to identify potential problems. Should any problems  arise concerning this procedure, the patient was given instructions to immediately contact us, at any time, without hesitation. In any case, we plan to contact the patient by telephone for a follow-up status report regarding this interventional procedure.  Comments:  No additional relevant information.  Plan of Care  Orders:  Orders Placed This Encounter  Procedures  . Radiofrequency,Lumbar    Scheduling Instructions:     Side(s): Left-sided     Level: L3-4, L4-5, & L5-S1 Facets (L2, L3, L4, L5, & S1 Medial Branch Nerves)     Sedation: With Sedation.     Timeframe: Today    Order Specific Question:   Where will this procedure be performed?    Answer:   [facility]  . Radiofrequency Sacroiliac Joint    Scheduling Instructions:     Side(s): Left-sided     Level(s): L4, L5, S1, S2, & S3 Medial Branch Nerve(s)     Sedation: With Sedation.     Timeframe: Today     Procedure: Sacroiliac joint RFA  . Radiofrequency,Lumbar    Standing Status:   Future    Standing Expiration Date:   11/21/2021    Scheduling Instructions:     Side(s): Right-sided     Level: L3-4, L4-5, & L5-S1 Facets (L2, L3, L4, L5, & S1 Medial Branch Nerves)     Sedation: Patient's choice.     Scheduling Timeframe: 2 weeks from now    Order Specific Question:   Where will this procedure be performed?    Answer:   [facility]  . Radiofrequency Sacroiliac Joint    Standing Status:   Future    Standing Expiration Date:   01/21/2021    Scheduling Instructions:     Side(s): Right-sided     Level(s): L4, L5, S1, S2, & S3 Medial Branch Nerve(s)     Sedation: Patient's choice.     Scheduling Timeframe: 2 to 6 weeks from now     Procedure: Sacroiliac joint RFA  . DG PAIN [facility]IN[facility] C-ARM 1-60 MIN NO REPORT    Intraoperative interpretation by procedural physician at  [place].    Standing Status:   Standing    Number of Occurrences:   1    Order Specific Question:   Reason for exam:    Answer:   Assistance in needle guidance and placement for procedures requiring needle placement in or near specific anatomical locations not easily accessible without such assistance.  . Informed Consent Details: Physician/Practitioner Attestation; Transcribe to consent form and obtain patient signature    Nursing Order: Transcribe to consent form and obtain patient signature. Note: Always confirm laterality of pain with Mr. Gunsch, before procedure.    Order Specific Question:   Physician/Practitioner attestation of informed consent for procedure/surgical case    Answer:   I, the physician/practitioner, attest that I have discussed with the patient the benefits, risks, side effects, alternatives, likelihood of achieving goals and potential problems during recovery for the procedure that I have provided informed consent.    Order Specific Question:   Procedure    Answer:   Lumbar Facet Radiofrequency Ablation    Order Specific Question:   Physician/Practitioner performing the procedure    Answer:   Korey Prashad A. Dossie Arbour, MD    Order Specific Question:   Indication/Reason    Answer:   Low Back Pain, with our without leg pain, due to Facet Joint Arthralgia (Joint Pain) known as Lumbar Facet Syndrome, secondary to Lumbar, and/or Lumbosacral Spondylosis (  Arthritis of the Spine), without myelopathy or radiculopathy (Nerve Damage).  . Informed Consent Details: Physician/Practitioner Attestation; Transcribe to consent form and obtain patient signature    Nursing Order: Transcribe to consent form and obtain patient signature. Note: Always confirm laterality of pain with Mr. Arave, before procedure.    Order Specific Question:   Physician/Practitioner attestation of informed consent for procedure/surgical case    Answer:   I, the physician/practitioner, attest that I have  discussed with the patient the benefits, risks, side effects, alternatives, likelihood of achieving goals and potential problems during recovery for the procedure that I have provided informed consent.    Order Specific Question:   Procedure    Answer:   Sacroiliac joint radiofrequency ablation    Order Specific Question:   Physician/Practitioner performing the procedure    Answer:   Javionna Leder A. Dossie Arbour, MD    Order Specific Question:   Indication/Reason    Answer:   Chronic sacroiliac joint pain  . Provide equipment / supplies at bedside    "Radiofrequency Tray"; Large hemostat (x1); Small hemostat (x1); Towels (x8); 4x4 sterile sponge pack (x1) Needle type: Teflon-coated Radiofrequency Needle (Disposable  single use) Size: Regular Quantity: 6    Standing Status:   Standing    Number of Occurrences:   1    Order Specific Question:   Specify    Answer:   Radiofrequency Tray   Chronic Opioid Analgesic:  No opioid analgesics prescribed by our practice. Morphine ER 15 mg daily (15 MME) + hydrocodone/APAP 10/325 1 tablet every 8 hours (45 MME/day) written by the [facility] [employer] [facility]" MME/day: 45 mg/day   Medications ordered for procedure: Meds ordered this encounter  Medications  . lidocaine (XYLOCAINE) 2 % (with pres) injection 400 mg  . DISCONTD: midazolam (VERSED) 5 MG/5ML injection 1-2 mg    Make sure Flumazenil is available in the pyxis when using this medication. If oversedation occurs, administer 0.2 mg IV over 15 sec. If after 45 sec no response, administer 0.2 mg again over 1 min; may repeat at 1 min intervals; not to exceed 4 doses (1 mg)  . DISCONTD: fentaNYL (SUBLIMAZE) injection 25-50 mcg    Make sure Narcan is available in the pyxis when using this medication. In the event of respiratory depression (RR< 8/min): Titrate NARCAN (naloxone) in increments of 0.1 to 0.2 mg IV at 2-3 minute intervals, until desired degree of reversal.  . ropivacaine (PF) 2 mg/mL (0.2%) (NAROPIN)  injection 18 mL  . triamcinolone acetonide (KENALOG-40) injection 80 mg   Medications administered: We administered lidocaine, ropivacaine (PF) 2 mg/mL (0.2%), and triamcinolone acetonide.  See the medical record for exact dosing, route, and time of administration.  Follow-up plan:   Return in about 2 weeks (around 12/05/2020) for RFA (37min): (R) L-FCT + SI RFA #1.       Interventional Therapies  Risk  Complexity Considerations:   WNL   Planned  Pending:   Therapeutic left lumbar facet + left SI RFA #1    Under consideration:   Therapeutic bilateral lumbar facet RFA #1, starting with the left side. Possible bilateral SI joint RFA #1 starting with the left side. Diagnostic left IA hip joint injection    Completed:   Diagnostic bilateral lumbar facet block x2 (05/23/2020) (100/100/70/>50)  Diagnostic bilateral SI joint block x1 (10/08/2020) (100/100/100 x3 to 4 days/50)    Therapeutic  Palliative (PRN) options:   Palliative bilateral lumbar facet block #3  Palliative bilateral SI joint block #2  Recent Visits Date Type Provider Dept  10/24/20 Office Visit Milinda Pointer, MD [facility]  10/08/20 Procedure visit Milinda Pointer, MD [facility]  Showing recent visits within past 90 days and meeting all other requirements Today's Visits Date Type Provider Dept  11/21/20 Procedure visit Milinda Pointer, MD [facility]  Showing today's visits and meeting all other requirements Future Appointments Date Type Provider Dept  12/05/20 Appointment Milinda Pointer, MD [facility]  Showing future appointments within next 90 days and meeting all other requirements  Disposition: Discharge home  Discharge (Date  Time): 11/21/2020; 1147 hrs.   Primary Care Physician: Ferdie Ping, MD Location: [facility] Note by: Gaspar Cola, MD Date: 11/21/2020; Time: 1:08 PM  Disclaimer:  Medicine is  not an Chief Strategy Officer. The only guarantee in medicine is that nothing is guaranteed. It is important to note that the decision to proceed with this intervention was based on the information collected from the patient. The Data and conclusions were drawn from the patient's questionnaire, the interview, and the physical examination. Because the information was provided in large part by the patient, it cannot be guaranteed that it has not been purposely or unconsciously manipulated. Every effort has been made to obtain as much relevant data as possible for this evaluation. It is important to note that the conclusions that lead to this procedure are derived in large part from the available data. Always take into account that the treatment will also be dependent on availability of resources and existing treatment guidelines, considered by other Pain Management Practitioners as being common knowledge and practice, at the time of the intervention. For Medico-Legal purposes, it is also important to point out that variation in procedural techniques and pharmacological choices are the acceptable norm. The indications, contraindications, technique, and results of the above procedure should only be interpreted and judged by a Board-Certified Interventional Pain Specialist with extensive familiarity and expertise in the same exact procedure and technique.

## 2020-11-21 NOTE — Progress Notes (Signed)
Safety precautions to be maintained throughout the outpatient stay will include: orient to surroundings, keep bed in low position, maintain call bell within reach at all times, provide assistance with transfer out of bed and ambulation.  

## 2020-11-21 NOTE — Patient Instructions (Signed)

## 2020-11-22 ENCOUNTER — Telehealth: Payer: Self-pay

## 2020-11-22 NOTE — Telephone Encounter (Signed)
Post procedure phone call.  Patient states he is doing good.  

## 2020-12-04 NOTE — Progress Notes (Signed)
PROVIDER NOTE: Information contained herein reflects review and annotations entered in association with encounter. Interpretation of such information and data should be left to medically-trained personnel. Information provided to patient can be located elsewhere in the medical record under "Patient Instructions". Document created using STT-dictation technology, any transcriptional errors that may result from process are unintentional.    Patient: Albert Gordon  Service Category: Procedure  Provider: Gaspar Cola, MD  DOB: [birth year]  DOS: 12/05/2020  Location: [facility]  MRN: [id number]  Setting: Ambulatory - outpatient  Referring Provider: Ferdie Ping, MD  Type: Established Patient  Specialty: Interventional Pain Management  PCP: Ferdie Ping, MD   Primary Reason for Visit: Interventional Pain Management Treatment. CC: Back Pain (Low right)  Procedure:          Anesthesia, Analgesia, Anxiolysis:  Type: Thermal Lumbar Facet, Medial Branch & Sacroiliac joint Radiofrequency Ablation  #1  Region: Lumbosacral Level: L2, L3, L4, L5, S1, S2, & S3 Medial Branch Level(s). These levels will denervate the L3-4, L4-5, and the L5-S1 lumbar facet joints, as well as the posterior Sacroiliac Joint innervation. Primary Purpose: Therapeutic Region: Posterolateral Lumbosacral Spine Laterality: Right  Type: Local Anesthesia Indication(s): Analgesia         Route: Infiltration (Miller/IM) IV Access: Declined Sedation: Declined  Local Anesthetic: Lidocaine 1-2%  Position: Prone   Indications: 1. Lumbar facet syndrome (Bilateral) (L>R)   2. Lumbar facet hypertrophy (Multilevel) (Bilateral)   3. Lumbar facet arthropathy (Multilevel) (Bilateral)   4. 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 Spondylosis without myelopathy or radiculopathy, lumbosacral region   6. Osteoarthritis of facet joint of lumbar spine   7. Chronic sacroiliac joint pain (Bilateral) (L>R)   8.  Enthesopathy of sacroiliac joint (Bilateral)   9. Other spondylosis, sacral and sacrococcygeal region   10. Somatic dysfunction of sacroiliac joints (Bilateral)   11. Degenerative joint disease (DJD) of lumbar spine   12. Decreased range of motion of lumbar spine    Albert Gordon has been dealing with the above chronic pain for longer than three months and has either failed to respond, was unable to tolerate, or simply did not get enough benefit from other more conservative therapies including, but not limited to: 1. Over-the-counter medications 2. Anti-inflammatory medications 3. Muscle relaxants 4. Membrane stabilizers 5. Opioids 6. Physical therapy and/or chiropractic manipulation 7. Modalities (Heat, ice, etc.) 8. Invasive techniques such as nerve blocks. Albert Gordon has attained more than 50% relief of the pain from a series of diagnostic injections conducted in separate occasions.  Pain Score: Pre-procedure: 4 /10 Post-procedure: 0-No pain/10  Post-Procedure Evaluation  Procedure (11/21/2020): Therapeutic left lumbar facet and SI joint RFA #1 under fluoroscopic guidance, no sedation Pre-procedure pain level: 5/10 Post-procedure: 0/10 (100% relief)  Sedation: None.  Effectiveness during initial hour after procedure(Ultra-Short Term Relief): 100 %.  Local anesthetic used: Long-acting (4-6 hours) Effectiveness: Defined as any analgesic benefit obtained secondary to the administration of local anesthetics. This carries significant diagnostic value as to the etiological location, or anatomical origin, of the pain. Duration of benefit is expected to coincide with the duration of the local anesthetic used.  Effectiveness during initial 4-6 hours after procedure(Short-Term Relief): 100 %.  Long-term benefit: Defined as any relief past the pharmacologic duration of the local anesthetics.  Effectiveness past the initial 6 hours after procedure(Long-Term Relief): 75 % (has been 2  weeks).  Current benefits: Defined as benefit that persist at this time.  Analgesia:  Only 2 weeks after the radiofrequency ablation the patient indicates having an ongoing 75% relief of his pain on the left side. Function: Albert Gordon reports improvement in function ROM: Albert Gordon reports improvement in ROM  Pre-op H&P Assessment:  Albert Gordon is a 75 y.o. (year old), male patient, seen today for interventional treatment. He  has a past surgical history that includes Hernia repair. Albert Gordon has a current medication list which includes the following prescription(s): atorvastatin, cyclobenzaprine, hydrocodone-acetaminophen, morphine, salicyclic acid-sulfur, tamsulosin, terazosin, and triamterene-hydrochlorothiazide. His primarily concern today is the Back Pain (Low right)  Initial Vital Signs:  Pulse/HCG Rate: 72ECG Heart Rate: 70 Temp: 97.7 F (36.5 C) Resp: 16 BP: 118/71 SpO2: 96 %  BMI: Estimated body mass index is 24.39 kg/m as calculated from the following:   Height as of this encounter: 5\' 10"  (1.778 m).   Weight as of this encounter: 170 lb (77.1 kg).  Risk Assessment: Allergies: Reviewed. He has No Known Allergies.  Allergy Precautions: None required Coagulopathies: Reviewed. None identified.  Blood-thinner therapy: None at this time Active Infection(s): Reviewed. None identified. Albert Gordon is afebrile  Site Confirmation: Albert Gordon was asked to confirm the procedure and laterality before marking the site Procedure checklist: Completed Consent: Before the procedure and under the influence of no sedative(s), amnesic(s), or anxiolytics, the patient was informed of the treatment options, risks and possible complications. To fulfill our ethical and legal obligations, as recommended by the American Medical Association's Code of Ethics, I have informed the patient of my clinical impression; the nature and purpose of the treatment or procedure; the risks, benefits, and possible  complications of the intervention; the alternatives, including doing nothing; the risk(s) and benefit(s) of the alternative treatment(s) or procedure(s); and the risk(s) and benefit(s) of doing nothing. The patient was provided information about the general risks and possible complications associated with the procedure. These may include, but are not limited to: failure to achieve desired goals, infection, bleeding, organ or nerve damage, allergic reactions, paralysis, and death. In addition, the patient was informed of those risks and complications associated to Spine-related procedures, such as failure to decrease pain; infection (i.e.: Meningitis, epidural or intraspinal abscess); bleeding (i.e.: epidural hematoma, subarachnoid hemorrhage, or any other type of intraspinal or peri-dural bleeding); organ or nerve damage (i.e.: Any type of peripheral nerve, nerve root, or spinal cord injury) with subsequent damage to sensory, motor, and/or autonomic systems, resulting in permanent pain, numbness, and/or weakness of one or several areas of the body; allergic reactions; (i.e.: anaphylactic reaction); and/or death. Furthermore, the patient was informed of those risks and complications associated with the medications. These include, but are not limited to: allergic reactions (i.e.: anaphylactic or anaphylactoid reaction(s)); adrenal axis suppression; blood sugar elevation that in diabetics may result in ketoacidosis or comma; water retention that in patients with history of congestive heart failure may result in shortness of breath, pulmonary edema, and decompensation with resultant heart failure; weight gain; swelling or edema; medication-induced neural toxicity; particulate matter embolism and blood vessel occlusion with resultant organ, and/or nervous system infarction; and/or aseptic necrosis of one or more joints. Finally, the patient was informed that Medicine is not an exact science; therefore, there is also  the possibility of unforeseen or unpredictable risks and/or possible complications that may result in a catastrophic outcome. The patient indicated having understood very clearly. We have given the patient no guarantees and we have made no promises. Enough time was given to the patient to ask  questions, all of which were answered to the patient's satisfaction. Mr. Botz has indicated that he wanted to continue with the procedure. Attestation: I, the ordering provider, attest that I have discussed with the patient the benefits, risks, side-effects, alternatives, likelihood of achieving goals, and potential problems during recovery for the procedure that I have provided informed consent. Date  Time: 12/05/2020 12:58 PM  Pre-Procedure Preparation:  Monitoring: As per clinic protocol. Respiration, ETCO2, SpO2, BP, heart rate and rhythm monitor placed and checked for adequate function Safety Precautions: Patient was assessed for positional comfort and pressure points before starting the procedure. Time-out: I initiated and conducted the "Time-out" before starting the procedure, as per protocol. The patient was asked to participate by confirming the accuracy of the "Time Out" information. Verification of the correct person, site, and procedure were performed and confirmed by me, the nursing staff, and the patient. "Time-out" conducted as per Joint Commission's Universal Protocol (UP.01.01.01). Time: 1330  Description of Procedure:          Laterality: Right Level: L2, L3, L4, L5, S1, S2, & S3 Medial Branch Level(s), at the L3-4, L4-5, and the L5-S1 lumbar facet joints, as well as the posterior Sacroiliac Joint. Area Prepped: Lumbosacral DuraPrep (Iodine Povacrylex [0.7% available iodine] and Isopropyl Alcohol, 74% w/w) Safety Precautions: Aspiration looking for blood return was conducted prior to all injections. At no point did we inject any substances, as a needle was being advanced. Before injecting, the  patient was told to immediately notify me if he was experiencing any new onset of "ringing in the ears, or metallic taste in the mouth". No attempts were made at seeking any paresthesias. Safe injection practices and needle disposal techniques used. Medications properly checked for expiration dates. SDV (single dose vial) medications used. After the completion of the procedure, all disposable equipment used was discarded in the proper designated medical waste containers. Local Anesthesia: Protocol guidelines were followed. The patient was positioned over the fluoroscopy table. The area was prepped in the usual manner. The time-out was completed. The target area was identified using fluoroscopy. A 12-in long, straight, sterile hemostat was used with fluoroscopic guidance to locate the targets for each level blocked. Once located, the skin was marked with an approved surgical skin marker. Once all sites were marked, the skin (epidermis, dermis, and hypodermis), as well as deeper tissues (fat, connective tissue and muscle) were infiltrated with a small amount of a short-acting local anesthetic, loaded on a 10cc syringe with a 25G, 1.5-in  Needle. An appropriate amount of time was allowed for local anesthetics to take effect before proceeding to the next step. Local Anesthetic: Lidocaine 2.0% The unused portion of the local anesthetic was discarded in the proper designated containers. Technical explanation of process:  Radiofrequency Ablation (RFA) L2 Medial Branch Nerve RFA: The target area for the L2 medial branch is at the junction of the postero-lateral aspect of the superior articular process and the superior, posterior, and medial edge of the transverse process of L3. Under fluoroscopic guidance, a Radiofrequency needle was inserted until contact was made with os over the superior postero-lateral aspect of the pedicular shadow (target area). Sensory and motor testing was conducted to properly adjust the  position of the needle. Once satisfactory placement of the needle was achieved, the numbing solution was slowly injected after negative aspiration for blood. 2.0 mL of the nerve block solution was injected without difficulty or complication. After waiting for at least 3 minutes, the ablation was performed. Once completed,  the needle was removed intact. L3 Medial Branch Nerve RFA: The target area for the L3 medial branch is at the junction of the postero-lateral aspect of the superior articular process and the superior, posterior, and medial edge of the transverse process of L4. Under fluoroscopic guidance, a Radiofrequency needle was inserted until contact was made with os over the superior postero-lateral aspect of the pedicular shadow (target area). Sensory and motor testing was conducted to properly adjust the position of the needle. Once satisfactory placement of the needle was achieved, the numbing solution was slowly injected after negative aspiration for blood. 2.0 mL of the nerve block solution was injected without difficulty or complication. After waiting for at least 3 minutes, the ablation was performed. Once completed, the needle was removed intact. L4 Medial Branch Nerve RFA: The target area for the L4 medial branch is at the junction of the postero-lateral aspect of the superior articular process and the superior, posterior, and medial edge of the transverse process of L5. Under fluoroscopic guidance, a Radiofrequency needle was inserted until contact was made with os over the superior postero-lateral aspect of the pedicular shadow (target area). Sensory and motor testing was conducted to properly adjust the position of the needle. Once satisfactory placement of the needle was achieved, the numbing solution was slowly injected after negative aspiration for blood. 2.0 mL of the nerve block solution was injected without difficulty or complication. After waiting for at least 3 minutes, the ablation was  performed. Once completed, the needle was removed intact. L5 Medial Branch Nerve RFA: The target area for the L5 medial branch is at the junction of the postero-lateral aspect of the superior articular process of S1 and the superior, posterior, and medial edge of the sacral ala. Under fluoroscopic guidance, a Radiofrequency needle was inserted until contact was made with os over the superior postero-lateral aspect of the pedicular shadow (target area). Sensory and motor testing was conducted to properly adjust the position of the needle. Once satisfactory placement of the needle was achieved, the numbing solution was slowly injected after negative aspiration for blood. 2.0 mL of the nerve block solution was injected without difficulty or complication. After waiting for at least 3 minutes, the ablation was performed. Once completed, the needle was removed intact. S1 Primary Dorsal Rami and Lateral Branch Nerve RFA: The target area for the S1 medial branch is located inferior to the junction of the S1 superior articular process and the L5 inferior articular process, posterior, inferior, and lateral to the 6 o'clock position of the L5-S1 facet joint, just superior to the S1 posterior foramen. Under fluoroscopic guidance, the Radiofrequency needle was advanced until contact was made with os over the Target area. Sensory and motor testing was conducted to properly adjust the position of the needle. Once satisfactory placement of the needle was achieved, the numbing solution was slowly injected after negative aspiration for blood. 2.0 mL of the nerve block solution was injected without difficulty or complication. After waiting for at least 3 minutes, the ablation was performed. Once completed, the needle was removed intact. S2 Primary Dorsal Rami and Lateral Branch Nerve RFA: The target area for the S2 medial branch is at the posterior superior lateral of the S2 posterior neural foramen. Under fluoroscopic guidance,  the Radiofrequency needle was advanced until contact was made with os over the Target area. Sensory and motor testing was conducted to properly adjust the position of the needle. Once satisfactory placement of the needle was achieved, the numbing  solution was slowly injected after negative aspiration for blood. 2.0 mL of the nerve block solution was injected without difficulty or complication. After waiting for at least 3 minutes, the ablation was performed. Once completed, the needle was removed intact. S3 Primary Dorsal Rami and Lateral Branch Nerve RFA: The target area for the S3 medial branch is at the posterior superior lateral of the S3 posterior neural foramen. Under fluoroscopic guidance, the Radiofrequency needle was advanced until contact was made with os over the Target area. Sensory and motor testing was conducted to properly adjust the position of the needle. Once satisfactory placement of the needle was achieved, the numbing solution was slowly injected after negative aspiration for blood. 2.0 mL of the nerve block solution was injected without difficulty or complication. After waiting for at least 3 minutes, the ablation was performed. Once completed, the needle was removed intact. Radiofrequency lesioning (ablation):  Radiofrequency Generator: NeuroTherm [id number] Sensory Stimulation Parameters: 50 Hz was used to locate & identify the nerve, making sure that the needle was positioned such that there was no sensory stimulation below 0.3 V or above 0.7 V. Motor Stimulation Parameters: 2 Hz was used to evaluate the motor component. Care was taken not to lesion any nerves that demonstrated motor stimulation of the lower extremities at an output of less than 2.5 times that of the sensory threshold, or a maximum of 2.0 V. Lesioning Technique Parameters: Standard Radiofrequency settings. (Not bipolar or pulsed.) Temperature Settings: 80 degrees C Lesioning time: 60 seconds Intra-operative Compliance:  Compliant Materials & Medications: Needle(s) (Electrode/Cannula) Type: Teflon-coated, curved tip, Radiofrequency needle(s) Gauge: 22G Length: 10cm Numbing solution: 0.2% PF-Ropivacaine + Triamcinolone (40 mg/mL) diluted to a final concentration of 4 mg of Triamcinolone/mL of Ropivacaine The unused portion of the solution was discarded in the proper designated containers.  Once the entire procedure was completed, the treated area was cleaned, making sure to leave some of the prepping solution back to take advantage of its long term bactericidal properties.    Illustration of the posterior view of the lumbar spine and the posterior neural structures. Laminae of L2 through S1 are labeled. DPRL5, dorsal primary ramus of L5; DPRS1, dorsal primary ramus of S1; DPR3, dorsal primary ramus of L3; FJ, facet (zygapophyseal) joint L3-L4; I, inferior articular process of L4; LB1, lateral branch of dorsal primary ramus of L1; IAB, inferior articular branches from L3 medial branch (supplies L4-L5 facet joint); IBP, intermediate branch plexus; MB3, medial branch of dorsal primary ramus of L3; NR3, third lumbar nerve root; S, superior articular process of L5; SAB, superior articular branches from L4 (supplies L4-5 facet joint also); TP3, transverse process of L3.  Vitals:   12/05/20 1358 12/05/20 1403 12/05/20 1408 12/05/20 1413  BP: 132/76 128/77 (!) 146/81 137/78  Pulse: (!) 59 60 62 63  Resp: 15 13 16 15   Temp:      TempSrc:      SpO2: 95% 96% 97% 96%  Weight:      Height:        Start Time: 1330 hrs. End Time: 1413 hrs.  Imaging Guidance (Spinal):          Type of Imaging Technique: Fluoroscopy Guidance (Spinal) Indication(s): Assistance in needle guidance and placement for procedures requiring needle placement in or near specific anatomical locations not easily accessible without such assistance. Exposure Time: Please see nurses notes. Contrast: None used. Fluoroscopic Guidance: I was personally  present during the use of fluoroscopy. "Tunnel Vision Technique" used to obtain  the best possible view of the target area. Parallax error corrected before commencing the procedure. "Direction-depth-direction" technique used to introduce the needle under continuous pulsed fluoroscopy. Once target was reached, antero-posterior, oblique, and lateral fluoroscopic projection used confirm needle placement in all planes. Images permanently stored in EMR. Interpretation: No contrast injected. I personally interpreted the imaging intraoperatively. Adequate needle placement confirmed in multiple planes. Permanent images saved into the patient's record.  Antibiotic Prophylaxis:   Anti-infectives (From admission, onward)   None     Indication(s): None identified  Post-operative Assessment:  Post-procedure Vital Signs:  Pulse/HCG Rate: 6370 Temp: 97.7 F (36.5 C) Resp: 15 BP: 137/78 SpO2: 96 %  EBL: None  Complications: No immediate post-treatment complications observed by team, or reported by patient.  Note: The patient tolerated the entire procedure well. A repeat set of vitals were taken after the procedure and the patient was kept under observation following institutional policy, for this type of procedure. Post-procedural neurological assessment was performed, showing return to baseline, prior to discharge. The patient was provided with post-procedure discharge instructions, including a section on how to identify potential problems. Should any problems arise concerning this procedure, the patient was given instructions to immediately contact us, at any time, without hesitation. In any case, we plan to contact the patient by telephone for a follow-up status report regarding this interventional procedure.  Comments:  No additional relevant information.  Plan of Care  Orders:  Orders Placed This Encounter  Procedures  . Radiofrequency,Lumbar    Scheduling Instructions:     Side(s): Right-sided      Level: L3-4, L4-5, & L5-S1 Facets (L2, L3, L4, L5, & S1 Medial Branch Nerves)     Sedation: With Sedation.     Timeframe: Today    Order Specific Question:   Where will this procedure be performed?    Answer:   [facility]  . Radiofrequency Sacroiliac Joint    Scheduling Instructions:     Side(s): Right-sided     Level(s): L4, L5, S1, S2, & S3 Medial Branch Nerve(s)     Sedation: With Sedation.     Timeframe: Today     Procedure: Sacroiliac joint RFA  . DG PAIN [facility]IN[facility] C-ARM 1-60 MIN NO REPORT    Intraoperative interpretation by procedural physician at [place].    Standing Status:   Standing    Number of Occurrences:   1    Order Specific Question:   Reason for exam:    Answer:   Assistance in needle guidance and placement for procedures requiring needle placement in or near specific anatomical locations not easily accessible without such assistance.  . Informed Consent Details: Physician/Practitioner Attestation; Transcribe to consent form and obtain patient signature    Nursing Order: Transcribe to consent form and obtain patient signature. Note: Always confirm laterality of pain with Mr. Newby, before procedure.    Order Specific Question:   Physician/Practitioner attestation of informed consent for procedure/surgical case    Answer:   I, the physician/practitioner, attest that I have discussed with the patient the benefits, risks, side effects, alternatives, likelihood of achieving goals and potential problems during recovery for the procedure that I have provided informed consent.    Order Specific Question:   Procedure    Answer:   Lumbar Facet Radiofrequency Ablation    Order Specific Question:   Physician/Practitioner performing the procedure    Answer:   Dashanti Burr A. Dossie Arbour, MD    Order Specific Question:   Indication/Reason  Answer:   Low Back Pain, with our without leg pain, due to Facet Joint Arthralgia (Joint Pain) known as Lumbar Facet  Syndrome, secondary to Lumbar, and/or Lumbosacral Spondylosis (Arthritis of the Spine), without myelopathy or radiculopathy (Nerve Damage).  . Provide equipment / supplies at bedside    "Radiofrequency Tray"; Large hemostat (x1); Small hemostat (x1); Towels (x8); 4x4 sterile sponge pack (x1) Needle type: Teflon-coated Radiofrequency Needle (Disposable  single use) Size: Regular Quantity: 5    Standing Status:   Standing    Number of Occurrences:   1    Order Specific Question:   Specify    Answer:   Radiofrequency Tray  . Informed Consent Details: Physician/Practitioner Attestation; Transcribe to consent form and obtain patient signature    Nursing Order: Transcribe to consent form and obtain patient signature. Note: Always confirm laterality of pain with Mr. Pierotti, before procedure.    Order Specific Question:   Physician/Practitioner attestation of informed consent for procedure/surgical case    Answer:   I, the physician/practitioner, attest that I have discussed with the patient the benefits, risks, side effects, alternatives, likelihood of achieving goals and potential problems during recovery for the procedure that I have provided informed consent.    Order Specific Question:   Procedure    Answer:   Sacroiliac joint radiofrequency ablation    Order Specific Question:   Physician/Practitioner performing the procedure    Answer:   Tanaya Dunigan A. Dossie Arbour, MD    Order Specific Question:   Indication/Reason    Answer:   Chronic sacroiliac joint pain   Chronic Opioid Analgesic:  No opioid analgesics prescribed by our practice. Morphine ER 15 mg daily (15 MME) + hydrocodone/APAP 10/325 1 tablet every 8 hours (45 MME/day) written by the [facility]" MME/day: 45 mg/day   Medications ordered for procedure: Meds ordered this encounter  Medications  . lidocaine (XYLOCAINE) 2 % (with pres) injection 400 mg  . ropivacaine (PF) 2 mg/mL (0.2%) (NAROPIN) injection 9 mL  . triamcinolone  acetonide (KENALOG-40) injection 40 mg  . ropivacaine (PF) 2 mg/mL (0.2%) (NAROPIN) injection 9 mL  . triamcinolone acetonide (KENALOG-40) injection 40 mg   Medications administered: We administered lidocaine, ropivacaine (PF) 2 mg/mL (0.2%), triamcinolone acetonide, ropivacaine (PF) 2 mg/mL (0.2%), and triamcinolone acetonide.  See the medical record for exact dosing, route, and time of administration.  Follow-up plan:   Return in about 6 weeks (around 01/16/2021) for procedure day (afternoon VV) (PPE).       Interventional Therapies  Risk  Complexity Considerations:   WNL   Planned  Pending:   Therapeutic right lumbar facet + left SI RFA #1 (12/05/2020)    Under consideration:   Diagnostic left IA hip joint injection    Completed:   Diagnostic bilateral lumbar facet block x2 (05/23/2020) (100/100/70/>50)  Therapeutic left lumbar facet RFA x1 (11/22/2018)  Diagnostic bilateral SI joint block x1 (10/08/2020) (100/100/100 x3 to 4 days/50)  Therapeutic left SI joint RFA x1 (11/22/2018)    Therapeutic  Palliative (PRN) options:   Palliative bilateral lumbar facet block #3  Palliative bilateral SI joint block #2     Recent Visits Date Type Provider Dept  11/21/20 Procedure visit Milinda Pointer, MD [facility]  10/24/20 Office Visit Milinda Pointer, MD [facility]  10/08/20 Procedure visit Milinda Pointer, MD [facility]  Showing recent visits within past 90 days and meeting all other requirements Today's Visits Date Type Provider Dept  12/05/20 Procedure visit Milinda Pointer,  MD [facility]  Showing today's visits and meeting all other requirements Future Appointments Date Type Provider Dept  01/16/21 Appointment Milinda Pointer, MD [facility]  Showing future appointments within next 90 days and meeting all other requirements  Disposition: Discharge home  Discharge (Date  Time): 12/05/2020; 1422 hrs.   Primary  Care Physician: Ferdie Ping, MD Location: [facility] Note by: Gaspar Cola, MD Date: 12/05/2020; Time: 2:26 PM  Disclaimer:  Medicine is not an Chief Strategy Officer. The only guarantee in medicine is that nothing is guaranteed. It is important to note that the decision to proceed with this intervention was based on the information collected from the patient. The Data and conclusions were drawn from the patient's questionnaire, the interview, and the physical examination. Because the information was provided in large part by the patient, it cannot be guaranteed that it has not been purposely or unconsciously manipulated. Every effort has been made to obtain as much relevant data as possible for this evaluation. It is important to note that the conclusions that lead to this procedure are derived in large part from the available data. Always take into account that the treatment will also be dependent on availability of resources and existing treatment guidelines, considered by other Pain Management Practitioners as being common knowledge and practice, at the time of the intervention. For Medico-Legal purposes, it is also important to point out that variation in procedural techniques and pharmacological choices are the acceptable norm. The indications, contraindications, technique, and results of the above procedure should only be interpreted and judged by a Board-Certified Interventional Pain Specialist with extensive familiarity and expertise in the same exact procedure and technique.

## 2020-12-05 ENCOUNTER — Ambulatory Visit
Admission: RE | Admit: 2020-12-05 | Discharge: 2020-12-05 | Disposition: A | Discharge status: Home / Self Care | Payer: No Typology Code available for payment source | Source: Ambulatory Visit | Attending: Pain Medicine | Admitting: Pain Medicine

## 2020-12-05 ENCOUNTER — Other Ambulatory Visit: Payer: Self-pay

## 2020-12-05 ENCOUNTER — Ambulatory Visit (HOSPITAL_BASED_OUTPATIENT_CLINIC_OR_DEPARTMENT_OTHER): Payer: No Typology Code available for payment source | Admitting: Pain Medicine

## 2020-12-05 ENCOUNTER — Encounter: Payer: Self-pay | Admitting: Pain Medicine

## 2020-12-05 VITALS — BP 137/78 | HR 63 | Temp 97.7°F | Resp 15 | Ht 70.0 in | Wt 170.0 lb

## 2020-12-05 DIAGNOSIS — M9904 Segmental and somatic dysfunction of sacral region: Secondary | ICD-10-CM | POA: Insufficient documentation

## 2020-12-05 DIAGNOSIS — G8929 Other chronic pain: Secondary | ICD-10-CM | POA: Diagnosis present

## 2020-12-05 DIAGNOSIS — M779 Enthesopathy, unspecified: Secondary | ICD-10-CM | POA: Diagnosis not present

## 2020-12-05 DIAGNOSIS — M4316 Spondylolisthesis, lumbar region: Secondary | ICD-10-CM | POA: Diagnosis not present

## 2020-12-05 DIAGNOSIS — M5386 Other specified dorsopathies, lumbar region: Secondary | ICD-10-CM | POA: Insufficient documentation

## 2020-12-05 DIAGNOSIS — M47898 Other spondylosis, sacral and sacrococcygeal region: Secondary | ICD-10-CM

## 2020-12-05 DIAGNOSIS — M47817 Spondylosis without myelopathy or radiculopathy, lumbosacral region: Secondary | ICD-10-CM

## 2020-12-05 DIAGNOSIS — M47816 Spondylosis without myelopathy or radiculopathy, lumbar region: Secondary | ICD-10-CM | POA: Diagnosis not present

## 2020-12-05 DIAGNOSIS — G8918 Other acute postprocedural pain: Secondary | ICD-10-CM

## 2020-12-05 DIAGNOSIS — M533 Sacrococcygeal disorders, not elsewhere classified: Secondary | ICD-10-CM | POA: Diagnosis not present

## 2020-12-05 MED ORDER — TRIAMCINOLONE ACETONIDE 40 MG/ML IJ SUSP
40.0000 mg | Freq: Once | INTRAMUSCULAR | Status: AC
  Administered 2020-12-05: 40 mg
  Filled 2020-12-05: qty 1

## 2020-12-05 MED ORDER — LIDOCAINE HCL 2 % IJ SOLN
20.0000 mL | Freq: Once | INTRAMUSCULAR | Status: AC
  Administered 2020-12-05: 400 mg
  Filled 2020-12-05: qty 40

## 2020-12-05 MED ORDER — ROPIVACAINE HCL 2 MG/ML IJ SOLN
9.0000 mL | Freq: Once | INTRAMUSCULAR | Status: AC
  Administered 2020-12-05: 9 mL via PERINEURAL
  Filled 2020-12-05: qty 10

## 2020-12-05 MED ORDER — TRIAMCINOLONE ACETONIDE 40 MG/ML IJ SUSP
40.0000 mg | Freq: Once | INTRAMUSCULAR | Status: AC
Start: 2020-12-05 — End: 2020-12-05
  Administered 2020-12-05: 40 mg
  Filled 2020-12-05: qty 1

## 2020-12-05 NOTE — Progress Notes (Signed)
Safety precautions to be maintained throughout the outpatient stay will include: orient to surroundings, keep bed in low position, maintain call bell within reach at all times, provide assistance with transfer out of bed and ambulation.  

## 2020-12-05 NOTE — Patient Instructions (Signed)

## 2020-12-06 ENCOUNTER — Telehealth: Payer: Self-pay

## 2020-12-06 NOTE — Telephone Encounter (Signed)
Pt missed PP follow up call. States that everything is fine and he is doing okay

## 2020-12-06 NOTE — Telephone Encounter (Signed)
Post procedure phone call.  Unable to leave message.

## 2020-12-12 ENCOUNTER — Telehealth: Payer: Self-pay | Admitting: Pain Medicine

## 2020-12-12 NOTE — Telephone Encounter (Signed)
Patient states he is trying to back off his meds and has discussed this with Dr. Dossie Arbour. The only problem with this is that it causes him to have restless back, which Dr. Dossie Arbour also knows. He wants to know if there is something Dr. Dossie Arbour can write for him that will help with this. I explained Dr. Dossie Arbour is out of office until Tuesday. Patient's appt is June 30. dont have any appts before that for Med Mgmt unless Dr. Dossie Arbour ok to add on.

## 2020-12-12 NOTE — Telephone Encounter (Signed)
Called patient back to let him know that Dr Dossie Arbour was on Vacation and will not be back in the office until.next Tuesday. Encouraged him to use heat. States he takes magnesium and eats bananas but is unable to sleep due to the spasms and decreasing his pain meds. Patient with understanding and will call next week  or [facility]. He has appt with them on June 1

## 2020-12-17 NOTE — Telephone Encounter (Signed)
Schedule him for a virtual visit on the afternoon of an evaluation day and note in the "schedule note": "Needs drug holiday guidance".  Thank you

## 2020-12-17 NOTE — Telephone Encounter (Signed)
Can you please schedule him an appt. thanks

## 2020-12-22 DIAGNOSIS — Z79891 Long term (current) use of opiate analgesic: Secondary | ICD-10-CM | POA: Insufficient documentation

## 2020-12-22 NOTE — Progress Notes (Addendum)
Patient: Albert Gordon  Service Category: E/M  Provider: Gaspar Cola, MD  DOB: 12/19/1945  DOS: 12/23/2020  Location: Office  MRN: [id number]  Setting: Ambulatory outpatient  Referring Provider: Ferdie Ping, MD  Type: Established Patient  Specialty: Interventional Pain Management  PCP: Ferdie Ping, MD  Location: Remote location  Delivery: TeleHealth     Virtual Encounter - Pain Management PROVIDER NOTE: Information contained herein reflects review and annotations entered in association with encounter. Interpretation of such information and data should be left to medically-trained personnel. Information provided to patient can be located elsewhere in the medical record under "Patient Instructions". Document created using STT-dictation technology, any transcriptional errors that may result from process are unintentional.    Contact & Pharmacy Preferred: [phone] Home: [phone] (home) Mobile: [phone] (mobile) E-mail: No e-mail address on record  [id number] [place] [id number]: [phone]: [phone]  Pre-screening  Mr. DDuszaoffered "in-person" vs "virtual" encounter. He indicated preferring virtual for this encounter.   Reason COVID-19*  Social distancing based on CDC and AMA recommendations.   I contacted NTonia Broomson 12/23/2020 via telephone.      I clearly identified myself as FGaspar Cola MD. I verified that I was speaking with the correct person using two identifiers (Name: NSEIBERT KEETER and date of birth: 5June 06, 1947.  Consent I sought verbal advanced consent from NTonia Broomsfor virtual visit interactions. I informed Mr. DBunteof possible security and privacy concerns, risks, and limitations associated with providing "not-in-person" medical evaluation and management services. I also informed Mr. DPoehlerof the availability of "in-person" appointments. Finally, I informed  him that there would be a charge for the virtual visit and that he could be  personally, fully or partially, financially responsible for it. Mr. DScherzerexpressed understanding and agreed to proceed.   Historic Elements   Mr. NNELTON AMSDENis a 75y.o. year old, male patient evaluated today after our last contact on 12/12/2020. Mr. DFurukawa has a past medical history of Hypertension. He also  has a past surgical history that includes Hernia repair. Mr. DYerohas a current medication list which includes the following prescription(s): atorvastatin, hydrocodone-acetaminophen, melatonin, morphine, ropinirole, salicyclic acid-sulfur, tamsulosin, terazosin, and triamterene-hydrochlorothiazide. He  reports that he has quit smoking. He has quit using smokeless tobacco. He reports current alcohol use. He reports previous drug use. Mr. DBramanhas No Known Allergies.   HPI  Today, he is being contacted for medication management.  [place] ER 15 mg daily (15 MME) + hydrocodone/APAP 10/325 3 times daily (30 MME/day) for a total of 45 MME's.  He is interested in getting some guidance as to how to taper his medications down to accomplish a "Drug Holiday".  I took the opportunity to explain the concept of tolerance and how "Drug Holidays" help control it.  I detailed how to do a slow taper to avoid withdrawals.   Since every tablet of morphine represents 15 MME's, while hydrocodone/APAP 5/325 represents 5 MME's, I would recommend talking to the prescribing physician to switch him from the hydrocodone/APAP 10/325 2 they hydrocodone/APAP 5/325, for the purpose of tapering the opioids down.  In addition, he is currently taking 15 MME's per day and an extended release form while taking 30 MME's per day in a short acting form of the  opioid analgesics.  Technically, if he was to take a short acting opioid for breakthrough, the actual definition of breakthrough  medicine is one that provides a total daily dose equivalent to no more than 10-15% of the total daily dose.  I would suggest that once he has been switched to the hydrocodone/APAP 5/325, that he then began to taper his opioid analgesics using the short acting hydrocodone.  I would recommend dropping the dose by 5 mg of hydrocodone (1 hydrocodone/APAP 5/325 pill) every 7 days.  By the end of the fifth week, he should be taking only the morphine ER 15 mg tablet daily.  At this point, this morphine ER should be converted to an equivalent dose of hydrocodone/APAP 5/325 3 times daily.  Once he has being switched, he should then proceed with the same taper, dropping 1 hydrocodone pill every 7 days.  By the end of the third week he should be completely off of all opioid analgesics.  At that time, he should then stay off of them for a period no shorter than 14 consecutive days in order to achieve the "Drug Holiday" interval.  Once he has completed the "Drug Holiday", he should be reassessed for the need to go back on any opioid analgesic regimen.  If he feels that he can do without it, he should be kept on nonopioid therapies such as a combination of an NSAID, muscle relaxant and perhaps a membrane stabilizer.  Previously I had written a prescription for him on 06/06/2020 for Flexeril 5 mg, 1 to 2 tablets p.o. at bedtime.  This was never renewed.  Assess effectiveness.  The patient indicates that the buspirone that he has is not the pain but the "restlessness" of his back.  He describes this as an inability to stay still secondary to a deep uncomfortable ache that he gets, mostly at bedtime.  He denies any sharp pain and he describes that the bilateral lumbar facet radiofrequency ablation did help considerably with his pain.  He is still enjoying the benefits of the radiofrequency.  Today we talked about several options and we have decided to do a trial of melatonin 10 to 20 mg p.o. at bedtime and Requip 0.25 mg  p.o. at bedtime, both of which are indicated for the treatment of restless leg syndrome.  I asked the patient about the Flexeril that we had given him before, but he indicated that this actually made the restlessness worse.  I will follow-up with him in 2 weeks to see how he is doing on these medications and to see whether or not he has started his opioid taper.  He indicates having an appointment with the prescribing physician for the opioid analgesics tomorrow.  I reminded him that he needs to talk to him about providing him with hydrocodone/APAP 5/325 pills instead of the 10/325 pills, for the purpose of completing the drug holiday as described above.  Post-Procedure Evaluation  Procedure (12/05/2020):  Type: Thermal Lumbar Facet, Medial Branch & Sacroiliac joint Radiofrequency Ablation  #1  Region: Lumbosacral Level: L2, L3, L4, L5, S1, S2, & S3 Medial Branch Level(s). These levels will denervate the L3-4, L4-5, and the L5-S1 lumbar facet joints, as well as the posterior Sacroiliac Joint innervation. Primary Purpose: Therapeutic Region: Posterolateral Lumbosacral Spine Laterality: Right   Type: Local Anesthesia Indication(s): Analgesia         Route: Infiltration (/IM) IV Access: Declined Sedation: Declined  Local Anesthetic: Lidocaine 1-2%   Position: Prone    Indications:  1. Lumbar facet syndrome (Bilateral) (L>R)   2. Lumbar facet hypertrophy (Multilevel) (Bilateral)   3. Lumbar facet arthropathy (Multilevel) (Bilateral)   4. 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 Spondylosis without myelopathy or radiculopathy, lumbosacral region   6. Osteoarthritis of facet joint of lumbar spine   7. Chronic sacroiliac joint pain (Bilateral) (L>R)   8. Enthesopathy of sacroiliac joint (Bilateral)   9. Other spondylosis, sacral and sacrococcygeal region   10. Somatic dysfunction of sacroiliac joints (Bilateral)   11. Degenerative joint disease (DJD) of lumbar spine   12.  Decreased range of motion of lumbar spine     Mr. Aiken has been dealing with the above chronic pain for longer than three months and has either failed to respond, was unable to tolerate, or simply did not get enough benefit from other more conservative therapies including, but not limited to: 1. Over-the-counter medications 2. Anti-inflammatory medications 3. Muscle relaxants 4. Membrane stabilizers 5. Opioids 6. Physical therapy and/or chiropractic manipulation 7. Modalities (Heat, ice, etc.) 8. Invasive techniques such as nerve blocks. Mr. Arvie has attained more than 50% relief of the pain from a series of diagnostic injections conducted in separate occasions.   Pain Score: Pre-procedure: 4 /10 Post-procedure: 0-No pain/10   Anxiolysis: Please see nurses note.  Effectiveness during initial hour after procedure (Ultra-Short Term Relief): 100 %.  Local anesthetic used: Long-acting (4-6 hours) Effectiveness: Defined as any analgesic benefit obtained secondary to the administration of local anesthetics. This carries significant diagnostic value as to the etiological location, or anatomical origin, of the pain. Duration of benefit is expected to coincide with the duration of the local anesthetic used.  Effectiveness during initial 4-6 hours after procedure (Short-Term Relief): 100 %.  Long-term benefit: Defined as any relief past the pharmacologic duration of the local anesthetics.  Effectiveness past the initial 6 hours after procedure (Long-Term Relief): 75 % (it has been 2 weeks since procedure.).  Benefits, current: Defined as benefit present at the time of this evaluation.   Analgesia: The patient indicates having an ongoing 75% relief of his low back pain. Function: Mr. Gander reports improvement in function ROM: Mr. Amescua reports improvement in ROM   Pharmacotherapy Assessment  Analgesic: No opioid analgesics prescribed by our practice. Morphine ER 15 mg daily (15 MME) +  hydrocodone/APAP 10/325 1 tablet every 8 hours (45 MME/day) written by the "[facility]" MME/day: 45 mg/day   Monitoring: [place] PMP: PDMP reviewed during this encounter.       Pharmacotherapy: No side-effects or adverse reactions reported. Compliance: No problems identified. Effectiveness: Clinically acceptable. Plan: Refer to "POC".  UDS:  Summary  Date Value Ref Range Status  01/29/2020 Note  Final    Comment:    ==================================================================== Compliance Drug Analysis, Ur ==================================================================== Test                             Result       Flag       Units  Drug Present and Declared for Prescription Verification   Morphine                       3126         EXPECTED   ng/mg creat   Normorphine                    100  EXPECTED   ng/mg creat    Potential sources of morphine include administration of codeine or    morphine, use of heroin, or ingestion of poppy seeds.     Normorphine is an expected metabolite of morphine.    Hydrocodone                    187          EXPECTED   ng/mg creat   Dihydrocodeine                 169          EXPECTED   ng/mg creat   Norhydrocodone                 3061         EXPECTED   ng/mg creat    Sources of hydrocodone include scheduled prescription medications.    Dihydrocodeine and norhydrocodone are expected metabolites of    hydrocodone. Dihydrocodeine is also available as a scheduled    prescription medication.    Acetaminophen                  PRESENT      EXPECTED ==================================================================== Test                      Result    Flag   Units      Ref Range   Creatinine              61               mg/dL      >=20 ==================================================================== Declared Medications:  The flagging and interpretation on this report are based on the  following declared medications.   Unexpected results may arise from  inaccuracies in the declared medications.   **Note: The testing scope of this panel includes these medications:   Hydrocodone (Norco)  Morphine (MSIR)   **Note: The testing scope of this panel does not include small to  moderate amounts of these reported medications:   Acetaminophen (Norco)   **Note: The testing scope of this panel does not include the  following reported medications:   Atorvastatin (Lipitor)  Hydrochlorothiazide (Maxzide)  Tamsulosin (Flomax)  Terazosin (Hytrin)  Triamterene (Maxzide) ==================================================================== For clinical consultation, please call [phone]. ====================================================================     Laboratory Chemistry Profile   Renal Lab Results  Component Value Date   BUN 15 01/29/2020   CREATININE 0.93 01/29/2020   BCR 16 01/29/2020   GFRAA 93 01/29/2020   GFRNONAA 80 01/29/2020     Hepatic Lab Results  Component Value Date   AST 18 01/29/2020   ALBUMIN 4.4 01/29/2020   ALKPHOS 50 01/29/2020     Electrolytes Lab Results  Component Value Date   NA 142 01/29/2020   K 4.8 01/29/2020   CL 102 01/29/2020   CALCIUM 9.4 01/29/2020   MG 2.3 01/29/2020     Bone Lab Results  Component Value Date   25OHVITD1 54 01/29/2020   25OHVITD2 <1.0 01/29/2020   25OHVITD3 54 01/29/2020     Inflammation (CRP: Acute Phase) (ESR: Chronic Phase) Lab Results  Component Value Date   CRP <1 01/29/2020   ESRSEDRATE 2 01/29/2020       Note: Above Lab results reviewed.  Imaging  DG PAIN CLINIC C-ARM 1-60 MIN NO REPORT Fluoro was used, but no Radiologist interpretation will be provided.  Please refer to "NOTES" tab for provider progress  note.  Assessment  The primary encounter diagnosis was Chronic pain syndrome. Diagnoses of Chronic low back pain (1ry area of Pain) (Bilateral) (L>R) w/o sciatica, Lumbar facet syndrome (Bilateral) (L>R),  Chronic lower extremity pain (Bilateral) (L>R), Chronic sacroiliac joint pain (Bilateral) (L>R), Pharmacologic therapy, Chronic use of opiate for therapeutic purpose, and Restless leg syndrome were also pertinent to this visit.  Plan of Care  Problem-specific:  No problem-specific Assessment & Plan notes found for this encounter.  Mr. TAJH LIVSEY has a current medication list which includes the following long-term medication(s): melatonin, ropinirole, terazosin, and triamterene-hydrochlorothiazide.  Pharmacotherapy (Medications Ordered): Meds ordered this encounter  Medications   Melatonin 10 MG CAPS    Sig: Take 10-20 mg by mouth at bedtime as needed. Must last 30 days.    Dispense:  60 capsule    Refill:  0    Do not place this medication, or any other prescription from our practice, on "Automatic Refill". Patient may have prescription filled one day early if pharmacy is closed on scheduled refill date.   rOPINIRole (REQUIP) 0.25 MG tablet    Sig: Take 1 tablet (0.25 mg total) by mouth at bedtime. Must last 30 days.    Dispense:  30 tablet    Refill:  0   Orders:  No orders of the defined types were placed in this encounter.  Follow-up plan:   Return in about 2 weeks (around 01/06/2021) for evaluation day (afternoon VV), to review (med) trial (melatonin/Requip).      Interventional Therapies  Risk  Complexity Considerations:   WNL   Planned  Pending:   Therapeutic right lumbar facet + left SI RFA #1 (12/05/2020)    Under consideration:   Diagnostic left IA hip joint injection    Completed:   Diagnostic bilateral lumbar facet block x2 (05/23/2020) (100/100/70/>50)  Therapeutic left lumbar facet RFA x1 (11/22/2018)  Diagnostic bilateral SI joint block x1 (10/08/2020) (100/100/100 x3 to 4 days/50)  Therapeutic left SI joint RFA x1 (11/22/2018)    Therapeutic  Palliative (PRN) options:   Palliative bilateral lumbar facet block #3  Palliative bilateral SI joint block #2       Recent Visits Date Type Provider Dept  12/05/20 Procedure visit Milinda Pointer, MD [facility]  11/21/20 Procedure visit Milinda Pointer, MD [facility]  10/24/20 Office Visit Milinda Pointer, MD [facility]  10/08/20 Procedure visit Milinda Pointer, MD [facility]  Showing recent visits within past 90 days and meeting all other requirements Today's Visits Date Type Provider Dept  12/23/20 Telemedicine Milinda Pointer, MD [facility]  Showing today's visits and meeting all other requirements Future Appointments Date Type Provider Dept  01/16/21 Appointment Milinda Pointer, MD [facility]  Showing future appointments within next 90 days and meeting all other requirements  I discussed the assessment and treatment plan with the patient. The patient was provided an opportunity to ask questions and all were answered. The patient agreed with the plan and demonstrated an understanding of the instructions.  Patient advised to call back or seek an in-person evaluation if the symptoms or condition worsens.  Duration of encounter: 20 minutes.  Note by: Gaspar Cola, MD Date: 12/23/2020; Time: 3:01 PM

## 2020-12-22 NOTE — Patient Instructions (Signed)
____________________________________________________________________________________________  Drug Holidays (Slow)  What is a "Drug Holiday"? Drug Holiday: is the name given to the period of time during which a patient stops taking a medication(s) for the purpose of eliminating tolerance to the drug.  Benefits . Improved effectiveness of opioids. . Decreased opioid dose needed to achieve benefits. . Improved pain with lesser dose.  What is tolerance? Tolerance: is the progressive decreased in effectiveness of a drug due to its repetitive use. With repetitive use, the body gets use to the medication and as a consequence, it loses its effectiveness. This is a common problem seen with opioid pain medications. As a result, a larger dose of the drug is needed to achieve the same effect that used to be obtained with a smaller dose.  How long should a "Drug Holiday" last? You should stay off of the pain medicine for at least 14 consecutive days. (2 weeks)  Should I stop the medicine "cold turkey"? No. You should always coordinate with your Pain Specialist so that he/she can provide you with the correct medication dose to make the transition as smoothly as possible.  How do I stop the medicine? Slowly. You will be instructed to decrease the daily amount of pills that you take by one (1) pill every seven (7) days. This is called a "slow downward taper" of your dose. For example: if you normally take four (4) pills per day, you will be asked to drop this dose to three (3) pills per day for seven (7) days, then to two (2) pills per day for seven (7) days, then to one (1) per day for seven (7) days, and at the end of those last seven (7) days, this is when the "Drug Holiday" would start.   Will I have withdrawals? By doing a "slow downward taper" like this one, it is unlikely that you will experience any significant withdrawal symptoms. Typically, what triggers withdrawals is the sudden stop of a high  dose opioid therapy. Withdrawals can usually be avoided by slowly decreasing the dose over a prolonged period of time. If you do not follow these instructions and decide to stop your medication abruptly, withdrawals may be possible.  What are withdrawals? Withdrawals: refers to the wide range of symptoms that occur after stopping or dramatically reducing opiate drugs after heavy and prolonged use. Withdrawal symptoms do not occur to patients that use low dose opioids, or those who take the medication sporadically. Contrary to benzodiazepine (example: Valium, Xanax, etc.) or alcohol withdrawals ("Delirium Tremens"), opioid withdrawals are not lethal. Withdrawals are the physical manifestation of the body getting rid of the excess receptors.  Expected Symptoms Early symptoms of withdrawal may include: . Agitation . Anxiety . Muscle aches . Increased tearing . Insomnia . Runny nose . Sweating . Yawning  Late symptoms of withdrawal may include: . Abdominal cramping . Diarrhea . Dilated pupils . Goose bumps . Nausea . Vomiting  Will I experience withdrawals? Due to the slow nature of the taper, it is very unlikely that you will experience any.  What is a slow taper? Taper: refers to the gradual decrease in dose.  (Last update: 02/07/2020) ____________________________________________________________________________________________     

## 2020-12-23 ENCOUNTER — Ambulatory Visit: Payer: No Typology Code available for payment source | Attending: Pain Medicine | Admitting: Pain Medicine

## 2020-12-23 ENCOUNTER — Other Ambulatory Visit: Payer: Self-pay

## 2020-12-23 DIAGNOSIS — G2581 Restless legs syndrome: Secondary | ICD-10-CM

## 2020-12-23 DIAGNOSIS — G8929 Other chronic pain: Secondary | ICD-10-CM

## 2020-12-23 DIAGNOSIS — G894 Chronic pain syndrome: Secondary | ICD-10-CM

## 2020-12-23 DIAGNOSIS — M545 Low back pain, unspecified: Secondary | ICD-10-CM | POA: Diagnosis not present

## 2020-12-23 DIAGNOSIS — M79605 Pain in left leg: Secondary | ICD-10-CM

## 2020-12-23 DIAGNOSIS — Z79899 Other long term (current) drug therapy: Secondary | ICD-10-CM

## 2020-12-23 DIAGNOSIS — M47816 Spondylosis without myelopathy or radiculopathy, lumbar region: Secondary | ICD-10-CM

## 2020-12-23 DIAGNOSIS — Z79891 Long term (current) use of opiate analgesic: Secondary | ICD-10-CM

## 2020-12-23 DIAGNOSIS — M79604 Pain in right leg: Secondary | ICD-10-CM | POA: Diagnosis not present

## 2020-12-23 DIAGNOSIS — M533 Sacrococcygeal disorders, not elsewhere classified: Secondary | ICD-10-CM

## 2020-12-23 MED ORDER — ROPINIROLE HCL 0.25 MG PO TABS: 0.2500 mg | ORAL_TABLET | Freq: Every day | ORAL | 0 refills | Status: DC

## 2020-12-23 MED ORDER — MELATONIN 10 MG PO CAPS: 10.0000 mg | ORAL_CAPSULE | Freq: Every evening | ORAL | 0 refills | Status: DC | PRN

## 2021-01-14 ENCOUNTER — Other Ambulatory Visit: Payer: Self-pay | Admitting: Pain Medicine

## 2021-01-14 DIAGNOSIS — G2581 Restless legs syndrome: Secondary | ICD-10-CM

## 2021-01-14 NOTE — Progress Notes (Signed)
Patient: Albert Gordon  Service Category: E/M  Provider: Gaspar Cola, MD  DOB: 1945-11-30  DOS: 01/15/2021  Location: Office  MRN: [id number]  Setting: Ambulatory outpatient  Referring Provider: No ref. provider found  Type: Established Patient  Specialty: Interventional Pain Management  PCP: Ferdie Ping, MD  Location: Remote location  Delivery: TeleHealth     Virtual Encounter - Pain Management PROVIDER NOTE: Information contained herein reflects review and annotations entered in association with encounter. Interpretation of such information and data should be left to medically-trained personnel. Information provided to patient can be located elsewhere in the medical record under "Patient Instructions". Document created using STT-dictation technology, any transcriptional errors that may result from process are unintentional.    Contact & Pharmacy Preferred: [phone] Home: [phone] (home) Mobile: [phone] (mobile) E-mail: No e-mail address on record  [id number] [place], [id number]: [phone]: [phone]  Pre-screening  Mr. DGarguilooffered "in-person" vs "virtual" encounter. He indicated preferring virtual for this encounter.   Reason COVID-19*  Social distancing based on CDC and AMA recommendations.   I contacted NTonia Broomson 01/15/2021 via telephone.      I clearly identified myself as FGaspar Cola MD. I verified that I was speaking with the correct person using two identifiers (Name: NLEWIN PELLOW and date of birth: 5October 28, 1947.  Consent I sought verbal advanced consent from NTonia Broomsfor virtual visit interactions. I informed Mr. DLleraof possible security and privacy concerns, risks, and limitations associated with providing "not-in-person" medical evaluation and management services. I also informed Mr. DKlemensof the availability of "in-person" appointments. Finally, I informed  him that there would be a charge for the virtual visit and that he could be  personally, fully or partially, financially responsible for it. Mr. DBibyexpressed understanding and agreed to proceed.   Historic Elements   Mr. NDONAVAN KERLINis a 75y.o. year old, male patient evaluated today after our last contact on 01/14/2021. Mr. DSanfilippo has a past medical history of Hypertension. He also  has a past surgical history that includes Hernia repair. Mr. DBertzhas a current medication list which includes the following prescription(s): atorvastatin, hydrocodone-acetaminophen, [START ON 01/22/2021] melatonin, morphine, ropinirole, salicyclic acid-sulfur, tamsulosin, terazosin, and triamterene-hydrochlorothiazide. He  reports that he has quit smoking. He has quit using smokeless tobacco. He reports current alcohol use. He reports previous drug use. Mr. DSchnorrhas No Known Allergies.   HPI  Today, he is being contacted for both, medication management and a post-procedure assessment.  On the patient's last visit we decided to do a trial of Requip/melatonin for his restless leg syndrome.  On this visit we reviewed his regimen and effectiveness.  He indicates that the Requip 0.25 mg/day is helping however, he refers still having to take pain medication as well.  Today he was informed that I would rather him not take any opioids in conjunction with the Requip.  See also indicated that he was not taking the melatonin and therefore I have encouraged him to give it a try Parenthesis at 10 mg and then at 20 mg, if the initial 10 mg dose is not enough.  Today I will increase it Requip to 0.5 mg/day and see if that does the trick.  He did indicate getting some benefit from the 0.25 mg dose where he was able to sleep better and he also indicated that  it helped his urinary difficulties secondary to his prostate hypertrophy.  RTCB: 02/21/2021  Pharmacotherapy Assessment  Analgesic: No opioid analgesics prescribed by our practice.  Morphine ER 15 mg daily (15 MME) + hydrocodone/APAP 10/325 1 tablet every 8 hours (45 MME/day) written by the "[facility] [employer] [facility]" MME/day: 45 mg/day   Monitoring: [place] PMP: PDMP reviewed during this encounter.       Pharmacotherapy: No side-effects or adverse reactions reported. Compliance: No problems identified. Effectiveness: Clinically acceptable. Plan: Refer to "POC".  UDS:  Summary  Date Value Ref Range Status  01/29/2020 Note  Final    Comment:    ==================================================================== Compliance Drug Analysis, Ur ==================================================================== Test                             Result       Flag       Units  Drug Present and Declared for Prescription Verification   Morphine                       3126         EXPECTED   ng/mg creat   Normorphine                    100          EXPECTED   ng/mg creat    Potential sources of morphine include administration of codeine or    morphine, use of heroin, or ingestion of poppy seeds.     Normorphine is an expected metabolite of morphine.    Hydrocodone                    187          EXPECTED   ng/mg creat   Dihydrocodeine                 169          EXPECTED   ng/mg creat   Norhydrocodone                 3061         EXPECTED   ng/mg creat    Sources of hydrocodone include scheduled prescription medications.    Dihydrocodeine and norhydrocodone are expected metabolites of    hydrocodone. Dihydrocodeine is also available as a scheduled    prescription medication.    Acetaminophen                  PRESENT      EXPECTED ==================================================================== Test                      Result    Flag   Units      Ref Range   Creatinine              61               mg/dL      >=20 ==================================================================== Declared Medications:  The flagging and interpretation on this report are based on  the  following declared medications.  Unexpected results may arise from  inaccuracies in the declared medications.   **Note: The testing scope of this panel includes these medications:   Hydrocodone (Norco)  Morphine (MSIR)   **Note: The testing scope of this panel does not include small to  moderate amounts of these reported medications:   Acetaminophen (Norco)   **  Note: The testing scope of this panel does not include the  following reported medications:   Atorvastatin (Lipitor)  Hydrochlorothiazide (Maxzide)  Tamsulosin (Flomax)  Terazosin (Hytrin)  Triamterene (Maxzide) ==================================================================== For clinical consultation, please call [phone]. ====================================================================     Laboratory Chemistry Profile   Renal Lab Results  Component Value Date   BUN 15 01/29/2020   CREATININE 0.93 01/29/2020   BCR 16 01/29/2020   GFRAA 93 01/29/2020   GFRNONAA 80 01/29/2020    Hepatic Lab Results  Component Value Date   AST 18 01/29/2020   ALBUMIN 4.4 01/29/2020   ALKPHOS 50 01/29/2020    Electrolytes Lab Results  Component Value Date   NA 142 01/29/2020   K 4.8 01/29/2020   CL 102 01/29/2020   CALCIUM 9.4 01/29/2020   MG 2.3 01/29/2020    Bone Lab Results  Component Value Date   25OHVITD1 54 01/29/2020   25OHVITD2 <1.0 01/29/2020   25OHVITD3 54 01/29/2020    Inflammation (CRP: Acute Phase) (ESR: Chronic Phase) Lab Results  Component Value Date   CRP <1 01/29/2020   ESRSEDRATE 2 01/29/2020          Note: Above Lab results reviewed.  Imaging  DG PAIN CLINIC C-ARM 1-60 MIN NO REPORT Fluoro was used, but no Radiologist interpretation will be provided.  Please refer to "NOTES" tab for provider progress note.  Assessment  The primary encounter diagnosis was Chronic pain syndrome. 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  Plan of Care  Problem-specific:  No problem-specific Assessment & Plan notes found for this encounter.  Mr. ABDULRAHMAN BRACEY has a current medication list which includes the following long-term medication(s): [START ON 01/22/2021] melatonin, ropinirole, terazosin, and triamterene-hydrochlorothiazide.  Pharmacotherapy (Medications Ordered): Meds ordered this encounter  Medications   Melatonin 10 MG CAPS    Sig: Take 10-20 mg by mouth at bedtime as needed. Must last 30 days.    Dispense:  60 capsule    Refill:  0    Do not place this medication, or any other prescription from our practice, on "Automatic Refill". Patient may have prescription filled one day early if pharmacy is closed on scheduled refill date.   rOPINIRole (REQUIP) 0.5 MG tablet    Sig: Take 1 tablet (0.5 mg total) by mouth at bedtime. Must last 30 days.    Dispense:  30 tablet    Refill:  0    Do not send refill request. Inform patient renewal requires appointment. Dispense 1 day early if closed on fill date. Generic permitted.    Orders:  No orders of the defined types were placed in this encounter.  Follow-up plan:   Return in about 1 month (around 02/14/2021) for evaluation day (afternoon VV) (MM), to review (med) trial (Requip 0.5 mg).     Interventional Therapies  Risk  Complexity Considerations:   WNL   Planned  Pending:   Therapeutic right lumbar facet + left SI RFA #1 (12/05/2020)    Under consideration:   Diagnostic left IA hip joint injection    Completed:   Diagnostic bilateral lumbar facet block x2 (05/23/2020) (100/100/70/>50)  Therapeutic left lumbar facet RFA  x1 (  11/22/2018)  Diagnostic bilateral SI joint block x1 (10/08/2020) (100/100/100 x3 to 4 days/50)  Therapeutic left SI joint RFA x1 (11/22/2018)    Therapeutic  Palliative (PRN) options:   Palliative bilateral lumbar facet block #3  Palliative bilateral SI joint block #2       Recent Visits Date Type Provider Dept  12/23/20 Telemedicine Milinda Pointer, MD [facility]  12/05/20 Procedure visit Milinda Pointer, MD [facility]  11/21/20 Procedure visit Milinda Pointer, MD [facility]  10/24/20 Office Visit Milinda Pointer, MD [facility]  Showing recent visits within past 90 days and meeting all other requirements Today's Visits Date Type Provider Dept  01/15/21 Telemedicine Milinda Pointer, MD [facility]  Showing today's visits and meeting all other requirements Future Appointments No visits were found meeting these conditions. Showing future appointments within next 90 days and meeting all other requirements I discussed the assessment and treatment plan with the patient. The patient was provided an opportunity to ask questions and all were answered. The patient agreed with the plan and demonstrated an understanding of the instructions.  Patient advised to call back or seek an in-person evaluation if the symptoms or condition worsens.  Duration of encounter: 12 minutes.  Note by: Gaspar Cola, MD Date: 01/15/2021; Time: 11:34 AM

## 2021-01-15 ENCOUNTER — Ambulatory Visit: Payer: No Typology Code available for payment source | Attending: Pain Medicine | Admitting: Pain Medicine

## 2021-01-15 ENCOUNTER — Other Ambulatory Visit: Payer: Self-pay

## 2021-01-15 DIAGNOSIS — M47816 Spondylosis without myelopathy or radiculopathy, lumbar region: Secondary | ICD-10-CM | POA: Diagnosis not present

## 2021-01-15 DIAGNOSIS — M545 Low back pain, unspecified: Secondary | ICD-10-CM

## 2021-01-15 DIAGNOSIS — G894 Chronic pain syndrome: Secondary | ICD-10-CM | POA: Diagnosis not present

## 2021-01-15 DIAGNOSIS — M4316 Spondylolisthesis, lumbar region: Secondary | ICD-10-CM

## 2021-01-15 DIAGNOSIS — Z79899 Other long term (current) drug therapy: Secondary | ICD-10-CM

## 2021-01-15 DIAGNOSIS — M79604 Pain in right leg: Secondary | ICD-10-CM | POA: Diagnosis not present

## 2021-01-15 DIAGNOSIS — G2581 Restless legs syndrome: Secondary | ICD-10-CM

## 2021-01-15 DIAGNOSIS — M533 Sacrococcygeal disorders, not elsewhere classified: Secondary | ICD-10-CM

## 2021-01-15 DIAGNOSIS — M79605 Pain in left leg: Secondary | ICD-10-CM

## 2021-01-15 DIAGNOSIS — G8929 Other chronic pain: Secondary | ICD-10-CM

## 2021-01-15 DIAGNOSIS — Z79891 Long term (current) use of opiate analgesic: Secondary | ICD-10-CM

## 2021-01-15 MED ORDER — ROPINIROLE HCL 0.5 MG PO TABS: 0.5000 mg | ORAL_TABLET | Freq: Every day | ORAL | 0 refills | Status: DC

## 2021-01-15 MED ORDER — MELATONIN 10 MG PO CAPS: 10.0000 mg | ORAL_CAPSULE | Freq: Every evening | ORAL | 0 refills | Status: AC | PRN

## 2021-01-16 ENCOUNTER — Telehealth: Payer: No Typology Code available for payment source | Admitting: Pain Medicine

## 2021-02-04 ENCOUNTER — Encounter: Payer: Self-pay | Admitting: Pain Medicine

## 2021-02-05 ENCOUNTER — Ambulatory Visit: Payer: No Typology Code available for payment source | Attending: Pain Medicine | Admitting: Pain Medicine

## 2021-02-05 ENCOUNTER — Other Ambulatory Visit: Payer: Self-pay

## 2021-02-05 DIAGNOSIS — M9904 Segmental and somatic dysfunction of sacral region: Secondary | ICD-10-CM | POA: Diagnosis not present

## 2021-02-05 DIAGNOSIS — G8929 Other chronic pain: Secondary | ICD-10-CM

## 2021-02-05 DIAGNOSIS — M779 Enthesopathy, unspecified: Secondary | ICD-10-CM

## 2021-02-05 DIAGNOSIS — G894 Chronic pain syndrome: Secondary | ICD-10-CM | POA: Diagnosis not present

## 2021-02-05 DIAGNOSIS — M533 Sacrococcygeal disorders, not elsewhere classified: Secondary | ICD-10-CM

## 2021-02-05 DIAGNOSIS — Z79899 Other long term (current) drug therapy: Secondary | ICD-10-CM

## 2021-02-05 DIAGNOSIS — E538 Deficiency of other specified B group vitamins: Secondary | ICD-10-CM | POA: Insufficient documentation

## 2021-02-05 DIAGNOSIS — M47898 Other spondylosis, sacral and sacrococcygeal region: Secondary | ICD-10-CM

## 2021-02-05 DIAGNOSIS — N138 Other obstructive and reflux uropathy: Secondary | ICD-10-CM | POA: Insufficient documentation

## 2021-02-05 DIAGNOSIS — G2581 Restless legs syndrome: Secondary | ICD-10-CM

## 2021-02-05 DIAGNOSIS — N401 Enlarged prostate with lower urinary tract symptoms: Secondary | ICD-10-CM | POA: Insufficient documentation

## 2021-02-05 DIAGNOSIS — Z79891 Long term (current) use of opiate analgesic: Secondary | ICD-10-CM

## 2021-02-05 MED ORDER — ROPINIROLE HCL 0.5 MG PO TABS: 0.5000 mg | ORAL_TABLET | Freq: Every day | ORAL | 5 refills | Status: AC

## 2021-02-05 NOTE — Progress Notes (Signed)
Patient: Albert Gordon  Service Category: E/M  Provider: Gaspar Cola, MD  DOB: 1945-10-26  DOS: 02/05/2021  Location: Office  MRN: [id number]  Setting: Ambulatory outpatient  Referring Provider: Ferdie Ping, MD  Type: Established Patient  Specialty: Interventional Pain Management  PCP: Albert Ping, MD  Location: Remote location  Delivery: TeleHealth     Virtual Encounter - Pain Management PROVIDER NOTE: Information contained herein reflects review and annotations entered in association with encounter. Interpretation of such information and data should be left to medically-trained personnel. Information provided to patient can be located elsewhere in the medical record under "Patient Instructions". Document created using STT-dictation technology, any transcriptional errors that may result from process are unintentional.    Contact & Pharmacy Preferred: [phone] Home: [phone] (home) Mobile: [phone] (mobile) E-mail: No e-mail address on record  [id number] [place] [id number]: [phone]: [phone]  Pre-screening  Albert Gordon "in-person" vs "virtual" encounter. He indicated preferring virtual for this encounter.   Reason COVID-19*  Social distancing based on CDC and AMA recommendations.   I contacted Albert Gordon 02/05/2021 via telephone.      I clearly identified myself as Albert Cola MD. I verified that I was speaking with the correct person using two identifiers (Name: Albert Gordon and date of birth: Albert Gordon.  Consent I sought verbal advanced consent from Albert Broomsfor virtual visit interactions. I informed Albert Gordon and privacy concerns, risks, and limitations associated with providing "not-in-person" medical evaluation and management services. I also informed Albert Gordon the availability of "in-person" appointments. Finally, I informed  him that there would be a charge for the virtual visit and that he could be  personally, fully or partially, financially responsible for it. Albert Gordon understanding and agreed to proceed.   Historic Elements   [name] a 75y.o. year old, male patient evaluated today after our last contact on 01/14/2021. Albert Gordon has a past medical history of Hypertension. He also  has a past surgical history that includes Hernia repair. Albert Gordon a current medication list which includes the following prescription(s): atorvastatin, hydrocodone-acetaminophen, melatonin, morphine, morphine, salicyclic acid-sulfur, tamsulosin, terazosin, triamterene-hydrochlorothiazide, and ropinirole. He  reports that he has quit smoking. He has quit using smokeless tobacco. He reports current alcohol use. He reports previous drug use. Albert Gordon No Known Allergies.   HPI  Today, he is being contacted for medication management.  The patient had being started on Requip, which we have been titrating for him.  At an initial dose of 0.25 mg/day indicated that it was helping, but he was still having to take some pain medication as well to be able to sleep.  On that visit, he was encouraged not to take the Requip in combination with the narcotics due to the possibility of a drug drug interaction.  Instead, I encouraged him to try melatonin and to try to use a dose between 10 and 20 mg to see if that would help him sleep.  On that same visit, I increased the Requip to 0.5 mg/day to see if that would do the trick.  Today's encounter is to evaluate dose changes.  The patient seems to have done very well with the new dose of the Requip and he describes that he sleeping much better.  However, he still having pain  in the lower portion of the back.  He states that after the radiofrequency the pain in the upper portion went away but he still having some pain in the lower most part of the back at the level of his waist/belt  line.  Previously we had looked at the possibility of him having a component from the sacroiliac joint and we did a single diagnostic injection on 10/08/2020 which did provide him with complete relief of the pain for the duration of the local anesthetic.  We follow that up with the radiofrequency of the lumbar facets and he describes that for the following 2 weeks after the radiofrequency's he had absolutely no pain however, the pain after that began to come back but just in the lowermost part that corresponds to the area of the SI joints.  For this reason, we have talked about it and we have decided to proceed with a second diagnostic injection of the sacroiliac joint and if this provides him with excellent relief of the pain for the duration of the local anesthetic, we will consider radiofrequency ablation of those joints.  He has agreed with this plan.  Pharmacotherapy Assessment   Analgesic: No opioid analgesics prescribed by our practice. Morphine ER 15 mg daily (15 MME) + hydrocodone/APAP 10/325 1 tablet every 8 hours (45 MME/day) written by the "[employer]" MME/day: 45 mg/day   Monitoring: [place] PMP: PDMP reviewed during this encounter.       Pharmacotherapy: No side-effects or adverse reactions reported. Compliance: No problems identified. Effectiveness: Clinically acceptable. Plan: Refer to "POC". UDS:  Summary  Date Value Ref Range Status  01/29/2020 Note  Final    Comment:    ==================================================================== Compliance Drug Analysis, Ur ==================================================================== Test                             Result       Flag       Units  Drug Present and Declared for Prescription Verification   Morphine                       3126         EXPECTED   ng/mg creat   Normorphine                    100          EXPECTED   ng/mg creat    Potential sources of morphine include administration of codeine or     morphine, use of heroin, or ingestion of poppy seeds.     Normorphine is an expected metabolite of morphine.    Hydrocodone                    187          EXPECTED   ng/mg creat   Dihydrocodeine                 169          EXPECTED   ng/mg creat   Norhydrocodone                 3061         EXPECTED   ng/mg creat    Sources of hydrocodone include scheduled prescription medications.    Dihydrocodeine and norhydrocodone are expected metabolites of    hydrocodone. Dihydrocodeine is also available as a scheduled  prescription medication.    Acetaminophen                  PRESENT      EXPECTED ==================================================================== Test                      Result    Flag   Units      Ref Range   Creatinine              61               mg/dL      >=20 ==================================================================== Declared Medications:  The flagging and interpretation on this report are based on the  following declared medications.  Unexpected results may arise from  inaccuracies in the declared medications.   **Note: The testing scope of this panel includes these medications:   Hydrocodone (Norco)  Morphine (MSIR)   **Note: The testing scope of this panel does not include small to  moderate amounts of these reported medications:   Acetaminophen (Norco)   **Note: The testing scope of this panel does not include the  following reported medications:   Atorvastatin (Lipitor)  Hydrochlorothiazide (Maxzide)  Tamsulosin (Flomax)  Terazosin (Hytrin)  Triamterene (Maxzide) ==================================================================== For clinical consultation, please call [phone]. ====================================================================      Laboratory Chemistry Profile   Renal Lab Results  Component Value Date   BUN 15 01/29/2020   CREATININE 0.93 01/29/2020   BCR 16 01/29/2020   GFRAA 93 01/29/2020    GFRNONAA 80 01/29/2020    Hepatic Lab Results  Component Value Date   AST 18 01/29/2020   ALBUMIN 4.4 01/29/2020   ALKPHOS 50 01/29/2020    Electrolytes Lab Results  Component Value Date   NA 142 01/29/2020   K 4.8 01/29/2020   CL 102 01/29/2020   CALCIUM 9.4 01/29/2020   MG 2.3 01/29/2020    Bone Lab Results  Component Value Date   25OHVITD1 54 01/29/2020   25OHVITD2 <1.0 01/29/2020   25OHVITD3 54 01/29/2020    Inflammation (CRP: Acute Phase) (ESR: Chronic Phase) Lab Results  Component Value Date   CRP <1 01/29/2020   ESRSEDRATE 2 01/29/2020         Note: Above Lab results reviewed.  Imaging  DG PAIN CLINIC C-ARM 1-60 MIN NO REPORT Fluoro was used, but no Radiologist interpretation will be provided.  Please refer to "NOTES" tab for provider progress note.  Assessment  The primary encounter diagnosis was Chronic pain syndrome. Diagnoses of Chronic sacroiliac joint pain (Bilateral) (L>R), Somatic dysfunction of sacroiliac joints (Bilateral), Other spondylosis, sacral and sacrococcygeal region, Enthesopathy of sacroiliac joint (Bilateral), Restless leg syndrome, Pharmacologic therapy, Chronic use of opiate for therapeutic purpose, and Encounter for medication management were also pertinent to this visit.  Plan of Care  Problem-specific:  No problem-specific Assessment & Plan notes found for this encounter.  Albert Gordon has a current medication list which includes the following long-term medication(s): melatonin, terazosin, triamterene-hydrochlorothiazide, and ropinirole.  Pharmacotherapy (Medications Ordered): Meds ordered this encounter  Medications   rOPINIRole (REQUIP) 0.5 MG tablet    Sig: Take 1 tablet (0.5 mg total) by mouth at bedtime. Must last 30 days.    Dispense:  30 tablet    Refill:  5    Do not send refill request. Inform patient renewal requires appointment. Dispense 1 day early if closed on fill date. Generic permitted.    Orders:  Orders  Placed This Encounter  Procedures   SACROILIAC JOINT INJECTION    Standing Status:   Future    Standing Expiration Date:   03/08/2021    Scheduling Instructions:     Side: Bilateral     Sedation: Patient's choice.     Timeframe: ASAP    Order Specific Question:   Where will this procedure be performed?    Answer:   [facility]    Follow-up plan:   Return for Procedure (no sedation): (B) SI Blk #2.     Interventional Therapies  Risk  Complexity Considerations:   WNL   Planned  Pending:   Diagnostic bilateral SI joint block #2    Under consideration:   Diagnostic bilateral SI joint RFA #1    Completed:   Diagnostic bilateral lumbar facet block x2 (05/23/2020) (100/100/70/>50)  Therapeutic left lumbar facet RFA x1 (11/22/2018)  Diagnostic bilateral SI joint block x1 (10/08/2020) (100/100/100 x3 to 4 days/50)  Therapeutic left SI joint RFA x1 (11/22/2018)    Therapeutic  Palliative (PRN) options:   Palliative bilateral lumbar facet block #3  Palliative bilateral SI joint block #2        Recent Visits Date Type Provider Dept  01/15/21 Telemedicine Milinda Pointer, MD [facility]  12/23/20 Telemedicine Milinda Pointer, MD [facility]  12/05/20 Procedure visit Milinda Pointer, MD [facility]  11/21/20 Procedure visit Milinda Pointer, MD [facility]  Showing recent visits within past 90 days and meeting all other requirements Today's Visits Date Type Provider Dept  02/05/21 Telemedicine Milinda Pointer, MD [facility]  Showing today's visits and meeting all other requirements Future Appointments No visits were found meeting these conditions. Showing future appointments within next 90 days and meeting all other requirements I discussed the assessment and treatment plan with the patient. The patient was provided an opportunity to ask questions and all were answered. The patient agreed with the plan and  demonstrated an understanding of the instructions.  Patient advised to call back or seek an in-person evaluation if the symptoms or condition worsens.  Duration of encounter: 15 minutes.  Note by: Albert Cola, MD Date: 02/05/2021; Time: 4:21 PM

## 2021-02-05 NOTE — Patient Instructions (Signed)
______________________________________________________________________  Preparing for your procedure (without sedation)  Procedure appointments are limited to planned procedures: No Prescription Refills. No disability issues will be discussed. No medication changes will be discussed.  Instructions: Oral Intake: Do not eat or drink anything for at least 6 hours prior to your procedure. (Exception: Blood Pressure Medication. See below.) Transportation: Unless otherwise stated by your physician, you may drive yourself after the procedure. Blood Pressure Medicine: Do not forget to take your blood pressure medicine with a sip of water the morning of the procedure. If your Diastolic (lower reading)is above 100 mmHg, elective cases will be cancelled/rescheduled. Blood thinners: These will need to be stopped for procedures. Notify our staff if you are taking any blood thinners. Depending on which one you take, there will be specific instructions on how and when to stop it. Diabetics on insulin: Notify the staff so that you can be scheduled 1st case in the morning. If your diabetes requires high dose insulin, take only  of your normal insulin dose the morning of the procedure and notify the staff that you have done so. Preventing infections: Shower with an antibacterial soap the morning of your procedure.  Build-up your immune system: Take 1000 mg of Vitamin C with every meal (3 times a day) the day prior to your procedure. Antibiotics: Inform the staff if you have a condition or reason that requires you to take antibiotics before dental procedures. Pregnancy: If you are pregnant, call and cancel the procedure. Sickness: If you have a cold, fever, or any active infections, call and cancel the procedure. Arrival: You must be in the facility at least 30 minutes prior to your scheduled procedure. Children: Do not bring any children with you. Dress appropriately: Bring dark clothing that you would not mind  if they get stained. Valuables: Do not bring any jewelry or valuables.  Reasons to call and reschedule or cancel your procedure: (Following these recommendations will minimize the risk of a serious complication.) Surgeries: Avoid having procedures within 2 weeks of any surgery. (Avoid for 2 weeks before or after any surgery). Flu Shots: Avoid having procedures within 2 weeks of a flu shots or . (Avoid for 2 weeks before or after immunizations). Barium: Avoid having a procedure within 7-10 days after having had a radiological study involving the use of radiological contrast. (Myelograms, Barium swallow or enema study). Heart attacks: Avoid any elective procedures or surgeries for the initial 6 months after a "Myocardial Infarction" (Heart Attack). Blood thinners: It is imperative that you stop these medications before procedures. Let us know if you if you take any blood thinner.  Infection: Avoid procedures during or within two weeks of an infection (including chest colds or gastrointestinal problems). Symptoms associated with infections include: Localized redness, fever, chills, night sweats or profuse sweating, burning sensation when voiding, cough, congestion, stuffiness, runny nose, sore throat, diarrhea, nausea, vomiting, cold or Flu symptoms, recent or current infections. It is specially important if the infection is over the area that we intend to treat. Heart and lung problems: Symptoms that may suggest an active cardiopulmonary problem include: cough, chest pain, breathing difficulties or shortness of breath, dizziness, ankle swelling, uncontrolled high or unusually low blood pressure, and/or palpitations. If you are experiencing any of these symptoms, cancel your procedure and contact your primary care physician for an evaluation.  Remember:  Regular Business hours are:  Monday to Thursday 8:00 AM to 4:00 PM  Provider's Schedule: Merrily Tegeler, MD:  Procedure days: Tuesday and Thursday    7:30 AM to 4:00 PM  Bilal Lateef, MD:  Procedure days: Monday and Wednesday 7:30 AM to 4:00 PM ______________________________________________________________________  ____________________________________________________________________________________________  General Risks and Possible Complications  Patient Responsibilities: It is important that you read this as it is part of your informed consent. It is our duty to inform you of the risks and possible complications associated with treatments offered to you. It is your responsibility as a patient to read this and to ask questions about anything that is not clear or that you believe was not covered in this document.  Patient's Rights: You have the right to refuse treatment. You also have the right to change your mind, even after initially having agreed to have the treatment done. However, under this last option, if you wait until the last second to change your mind, you may be charged for the materials used up to that point.  Introduction: Medicine is not an exact science. Everything in Medicine, including the lack of treatment(s), carries the potential for danger, harm, or loss (which is by definition: Risk). In Medicine, a complication is a secondary problem, condition, or disease that can aggravate an already existing one. All treatments carry the risk of possible complications. The fact that a side effects or complications occurs, does not imply that the treatment was conducted incorrectly. It must be clearly understood that these can happen even when everything is done following the highest safety standards.  No treatment: You can choose not to proceed with the proposed treatment alternative. The "PRO(s)" would include: avoiding the risk of complications associated with the therapy. The "CON(s)" would include: not getting any of the treatment benefits. These benefits fall under one of three categories: diagnostic; therapeutic; and/or palliative.  Diagnostic benefits include: getting information which can ultimately lead to improvement of the disease or symptom(s). Therapeutic benefits are those associated with the successful treatment of the disease. Finally, palliative benefits are those related to the decrease of the primary symptoms, without necessarily curing the condition (example: decreasing the pain from a flare-up of a chronic condition, such as incurable terminal cancer).  General Risks and Complications: These are associated to most interventional treatments. They can occur alone, or in combination. They fall under one of the following six (6) categories: no benefit or worsening of symptoms; bleeding; infection; nerve damage; allergic reactions; and/or death. No benefits or worsening of symptoms: In Medicine there are no guarantees, only probabilities. No healthcare provider can ever guarantee that a medical treatment will work, they can only state the probability that it may. Furthermore, there is always the possibility that the condition may worsen, either directly, or indirectly, as a consequence of the treatment. Bleeding: This is more common if the patient is taking a blood thinner, either prescription or over the counter (example: Goody Powders, Fish oil, Aspirin, Garlic, etc.), or if suffering a condition associated with impaired coagulation (example: Hemophilia, cirrhosis of the liver, low platelet counts, etc.). However, even if you do not have one on these, it can still happen. If you have any of these conditions, or take one of these drugs, make sure to notify your treating physician. Infection: This is more common in patients with a compromised immune system, either due to disease (example: diabetes, cancer, human immunodeficiency virus [HIV], etc.), or due to medications or treatments (example: therapies used to treat cancer and rheumatological diseases). However, even if you do not have one on these, it can still happen. If you  have any of these conditions, or take   one of these drugs, make sure to notify your treating physician. Nerve Damage: This is more common when the treatment is an invasive one, but it can also happen with the use of medications, such as those used in the treatment of cancer. The damage can occur to small secondary nerves, or to large primary ones, such as those in the spinal cord and brain. This damage may be temporary or permanent and it may lead to impairments that can range from temporary numbness to permanent paralysis and/or brain death. Allergic Reactions: Any time a substance or material comes in contact with our body, there is the possibility of an allergic reaction. These can range from a mild skin rash (contact dermatitis) to a severe systemic reaction (anaphylactic reaction), which can result in death. Death: In general, any medical intervention can result in death, most of the time due to an unforeseen complication. ____________________________________________________________________________________________  

## 2021-02-13 ENCOUNTER — Encounter: Payer: Self-pay | Admitting: Pain Medicine

## 2021-02-13 ENCOUNTER — Other Ambulatory Visit: Payer: Self-pay

## 2021-02-13 ENCOUNTER — Ambulatory Visit
Admission: RE | Admit: 2021-02-13 | Discharge: 2021-02-13 | Disposition: A | Discharge status: Home / Self Care | Payer: No Typology Code available for payment source | Source: Ambulatory Visit | Attending: Pain Medicine | Admitting: Pain Medicine

## 2021-02-13 ENCOUNTER — Ambulatory Visit (HOSPITAL_BASED_OUTPATIENT_CLINIC_OR_DEPARTMENT_OTHER): Payer: No Typology Code available for payment source | Admitting: Pain Medicine

## 2021-02-13 ENCOUNTER — Other Ambulatory Visit: Payer: Self-pay | Admitting: Pain Medicine

## 2021-02-13 VITALS — BP 154/72 | HR 56 | Temp 97.2°F | Resp 16 | Ht 70.0 in | Wt 170.0 lb

## 2021-02-13 DIAGNOSIS — M779 Enthesopathy, unspecified: Secondary | ICD-10-CM | POA: Diagnosis not present

## 2021-02-13 DIAGNOSIS — M47898 Other spondylosis, sacral and sacrococcygeal region: Secondary | ICD-10-CM | POA: Insufficient documentation

## 2021-02-13 DIAGNOSIS — G8929 Other chronic pain: Secondary | ICD-10-CM | POA: Insufficient documentation

## 2021-02-13 DIAGNOSIS — Z79899 Other long term (current) drug therapy: Secondary | ICD-10-CM

## 2021-02-13 DIAGNOSIS — M9904 Segmental and somatic dysfunction of sacral region: Secondary | ICD-10-CM

## 2021-02-13 DIAGNOSIS — G2581 Restless legs syndrome: Secondary | ICD-10-CM

## 2021-02-13 DIAGNOSIS — M533 Sacrococcygeal disorders, not elsewhere classified: Secondary | ICD-10-CM | POA: Insufficient documentation

## 2021-02-13 MED ORDER — LIDOCAINE HCL 2 % IJ SOLN
20.0000 mL | Freq: Once | INTRAMUSCULAR | Status: AC
  Administered 2021-02-13: 400 mg

## 2021-02-13 MED ORDER — ROPIVACAINE HCL 2 MG/ML IJ SOLN
9.0000 mL | Freq: Once | INTRAMUSCULAR | Status: AC
  Administered 2021-02-13: 9 mL via INTRA_ARTICULAR

## 2021-02-13 MED ORDER — METHYLPREDNISOLONE ACETATE 80 MG/ML IJ SUSP
80.0000 mg | Freq: Once | INTRAMUSCULAR | Status: AC
  Administered 2021-02-13: 80 mg via INTRA_ARTICULAR

## 2021-02-13 MED ORDER — METHYLPREDNISOLONE ACETATE 80 MG/ML IJ SUSP
INTRAMUSCULAR | Status: AC
  Filled 2021-02-13: qty 1

## 2021-02-13 MED ORDER — LIDOCAINE HCL 2 % IJ SOLN
INTRAMUSCULAR | Status: AC
  Filled 2021-02-13: qty 10

## 2021-02-13 NOTE — Patient Instructions (Signed)

## 2021-02-13 NOTE — Progress Notes (Signed)
PROVIDER NOTE: Information contained herein reflects review and annotations entered in association with encounter. Interpretation of such information and data should be left to medically-trained personnel. Information provided to patient can be located elsewhere in the medical record under "Patient Instructions". Document created using STT-dictation technology, any transcriptional errors that may result from process are unintentional.    Patient: Albert Gordon  Service Category: Procedure  Provider: Gaspar Cola, MD  DOB: 05/31/1946  DOS: 02/13/2021  Location: [facility]  MRN: [id number]  Setting: Ambulatory - outpatient  Referring Provider: Ferdie Ping, MD  Type: Established Patient  Specialty: Interventional Pain Management  PCP: Ferdie Ping, MD   Primary Reason for Visit: Interventional Pain Management Treatment. CC: Back Pain (low)  Procedure:          Anesthesia, Analgesia, Anxiolysis:  Type: Diagnostic Sacroiliac Joint Steroid Injection          Region: Inferior Lumbosacral Region Level: PIIS (Posterior Inferior Iliac Spine) Laterality: Bilateral  Type: Local Anesthesia Indication(s): Analgesia         Route: Infiltration ([place]/IM) IV Access: Declined Sedation: Declined  Local Anesthetic: Lidocaine 1-2%  Position: Prone           Indications: 1. Chronic sacroiliac joint pain (Bilateral) (L>R)   2. Somatic dysfunction of sacroiliac joints (Bilateral)   3. Other spondylosis, sacral and sacrococcygeal region   4. Enthesopathy of sacroiliac joint (Bilateral)    Pain Score: Pre-procedure: 5 /10 Post-procedure: 0-No pain/10   Pre-op H&P Assessment:  Mr. Kratovil is a 75 y.o. (year old), male patient, seen today for interventional treatment. He  has a past surgical history that includes Hernia repair. Mr. Steely has a current medication list which includes the following prescription(s): atorvastatin, finasteride, hydrocodone-acetaminophen, melatonin,  morphine, morphine, ropinirole, salicyclic acid-sulfur, tamsulosin, terazosin, and triamterene-hydrochlorothiazide. His primarily concern today is the Back Pain (low)  Initial Vital Signs:  Pulse/HCG Rate: (!) 59  Temp: (!) 97.2 F (36.2 C) Resp: 16 BP: (!) 156/78 SpO2: 98 %  BMI: Estimated body mass index is 24.39 kg/m as calculated from the following:   Height as of this encounter: '5\' 10"'$  (1.778 m).   Weight as of this encounter: 170 lb (77.1 kg).  Risk Assessment: Allergies: Reviewed. He has No Known Allergies.  Allergy Precautions: None required Coagulopathies: Reviewed. None identified.  Blood-thinner therapy: None at this time Active Infection(s): Reviewed. None identified. Mr. Antilla is afebrile  Site Confirmation: Mr. Carrero was asked to confirm the procedure and laterality before marking the site Procedure checklist: Completed Consent: Before the procedure and under the influence of no sedative(s), amnesic(s), or anxiolytics, the patient was informed of the treatment options, risks and possible complications. To fulfill our ethical and legal obligations, as recommended by the American Medical Association's Code of Ethics, I have informed the patient of my clinical impression; the nature and purpose of the treatment or procedure; the risks, benefits, and possible complications of the intervention; the alternatives, including doing nothing; the risk(s) and benefit(s) of the alternative treatment(s) or procedure(s); and the risk(s) and benefit(s) of doing nothing. The patient was provided information about the general risks and possible complications associated with the procedure. These may include, but are not limited to: failure to achieve desired goals, infection, bleeding, organ or nerve damage, allergic reactions, paralysis, and death. In addition, the patient was informed of those risks and complications associated to the procedure, such as failure to decrease pain; infection;  bleeding; organ or nerve damage with  subsequent damage to sensory, motor, and/or autonomic systems, resulting in permanent pain, numbness, and/or weakness of one or several areas of the body; allergic reactions; (i.e.: anaphylactic reaction); and/or death. Furthermore, the patient was informed of those risks and complications associated with the medications. These include, but are not limited to: allergic reactions (i.e.: anaphylactic or anaphylactoid reaction(s)); adrenal axis suppression; blood sugar elevation that in diabetics may result in ketoacidosis or comma; water retention that in patients with history of congestive heart failure may result in shortness of breath, pulmonary edema, and decompensation with resultant heart failure; weight gain; swelling or edema; medication-induced neural toxicity; particulate matter embolism and blood vessel occlusion with resultant organ, and/or nervous system infarction; and/or aseptic necrosis of one or more joints. Finally, the patient was informed that Medicine is not an exact science; therefore, there is also the possibility of unforeseen or unpredictable risks and/or possible complications that may result in a catastrophic outcome. The patient indicated having understood very clearly. We have given the patient no guarantees and we have made no promises. Enough time was given to the patient to ask questions, all of which were answered to the patient's satisfaction. Mr. Deraad has indicated that he wanted to continue with the procedure. Attestation: I, the ordering provider, attest that I have discussed with the patient the benefits, risks, side-effects, alternatives, likelihood of achieving goals, and potential problems during recovery for the procedure that I have provided informed consent. Date  Time: 02/13/2021 11:10 AM  Pre-Procedure Preparation:  Monitoring: As per clinic protocol. Respiration, ETCO2, SpO2, BP, heart rate and rhythm monitor placed and checked  for adequate function Safety Precautions: Patient was assessed for positional comfort and pressure points before starting the procedure. Time-out: I initiated and conducted the "Time-out" before starting the procedure, as per protocol. The patient was asked to participate by confirming the accuracy of the "Time Out" information. Verification of the correct person, site, and procedure were performed and confirmed by me, the nursing staff, and the patient. "Time-out" conducted as per Joint Commission's Universal Protocol (UP.01.01.01). Time: 1127  Description of Procedure:          Target Area: Inferior, posterior, aspect of the sacroiliac fissure Approach: Posterior, paraspinal, ipsilateral approach. Area Prepped: Entire Lower Lumbosacral Region DuraPrep (Iodine Povacrylex [0.7% available iodine] and Isopropyl Alcohol, 74% w/w) Safety Precautions: Aspiration looking for blood return was conducted prior to all injections. At no point did we inject any substances, as a needle was being advanced. No attempts were made at seeking any paresthesias. Safe injection practices and needle disposal techniques used. Medications properly checked for expiration dates. SDV (single dose vial) medications used. Description of the Procedure: Protocol guidelines were followed. The patient was placed in position over the procedure table. The target area was identified and the area prepped in the usual manner. Skin & deeper tissues infiltrated with local anesthetic. Appropriate amount of time allowed to pass for local anesthetics to take effect. The procedure needle was advanced under fluoroscopic guidance into the sacroiliac joint until a firm endpoint was obtained. Proper needle placement secured. Negative aspiration confirmed. Solution injected in intermittent fashion, asking for systemic symptoms every 0.5cc of injectate. The needles were then removed and the area cleansed, making sure to leave some of the prepping  solution back to take advantage of its long term bactericidal properties. Vitals:   02/13/21 1110 02/13/21 1121 02/13/21 1131 02/13/21 1136  BP: (!) 156/78 (!) 156/74 (!) 153/74 (!) 154/72  Pulse: (!) 59 (!) 58 Marland Kitchen)  56 (!) 56  Resp: '16 17 15 16  '$ Temp:      SpO2: 98% 99% 97% 98%  Weight:      Height:        Start Time: 1127 hrs. End Time: 1135 hrs. Materials:  Needle(s) Type: Spinal Needle Gauge: 22G Length: 5.0-in Medication(s): Please see orders for medications and dosing details.  Imaging Guidance (Non-Spinal):          Type of Imaging Technique: Fluoroscopy Guidance (Non-Spinal) Indication(s): Assistance in needle guidance and placement for procedures requiring needle placement in or near specific anatomical locations not easily accessible without such assistance. Exposure Time: Please see nurses notes. Contrast: Before injecting any contrast, we confirmed that the patient did not have an allergy to iodine, shellfish, or radiological contrast. Once satisfactory needle placement was completed at the desired level, radiological contrast was injected. Contrast injected under live fluoroscopy. No contrast complications. See chart for type and volume of contrast used. Fluoroscopic Guidance: I was personally present during the use of fluoroscopy. "Tunnel Vision Technique" used to obtain the best possible view of the target area. Parallax error corrected before commencing the procedure. "Direction-depth-direction" technique used to introduce the needle under continuous pulsed fluoroscopy. Once target was reached, antero-posterior, oblique, and lateral fluoroscopic projection used confirm needle placement in all planes. Images permanently stored in EMR. Interpretation: I personally interpreted the imaging intraoperatively. Adequate needle placement confirmed in multiple planes. Appropriate spread of contrast into desired area was observed. No evidence of afferent or efferent intravascular uptake.  Permanent images saved into the patient's record.  Antibiotic Prophylaxis:   Anti-infectives (From admission, onward)    None      Indication(s): None identified  Post-operative Assessment:  Post-procedure Vital Signs:  Pulse/HCG Rate: (!) 56 (SB)  Temp: (!) 97.2 F (36.2 C) Resp: 16 BP: (!) 154/72 SpO2: 98 %  EBL: None  Complications: No immediate post-treatment complications observed by team, or reported by patient.  Note: The patient tolerated the entire procedure well. A repeat set of vitals were taken after the procedure and the patient was kept under observation following institutional policy, for this type of procedure. Post-procedural neurological assessment was performed, showing return to baseline, prior to discharge. The patient was provided with post-procedure discharge instructions, including a section on how to identify potential problems. Should any problems arise concerning this procedure, the patient was given instructions to immediately contact us, at any time, without hesitation. In any case, we plan to contact the patient by telephone for a follow-up status report regarding this interventional procedure.  Comments:  No additional relevant information.  Plan of Care  Orders:  Orders Placed This Encounter  Procedures   SACROILIAC JOINT INJECTION    Scheduling Instructions:     Side: Bilateral     Sedation: No Sedation.     Timeframe: Today    Order Specific Question:   Where will this procedure be performed?    Answer:   [facility]   DG PAIN [facility] C-ARM 1-60 MIN NO REPORT    Intraoperative interpretation by procedural physician at [place].    Standing Status:   Standing    Number of Occurrences:   1    Order Specific Question:   Reason for exam:    Answer:   Assistance in needle guidance and placement for procedures requiring needle placement in or near specific anatomical locations not easily accessible without such assistance.    Informed Consent Details: Physician/Practitioner Attestation; Transcribe to consent  form and obtain patient signature    Nursing Order: Transcribe to consent form and obtain patient signature. Note: Always confirm laterality of pain with Mr. Gurganious, before procedure.    Order Specific Question:   Physician/Practitioner attestation of informed consent for procedure/surgical case    Answer:   I, the physician/practitioner, attest that I have discussed with the patient the benefits, risks, side effects, alternatives, likelihood of achieving goals and potential problems during recovery for the procedure that I have provided informed consent.    Order Specific Question:   Procedure    Answer:   Sacroiliac Joint Block    Order Specific Question:   Physician/Practitioner performing the procedure    Answer:   Annalis Kaczmarczyk A. 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 Morphine ER 15 mg daily (15 MME) + hydrocodone/APAP 10/325 1 tablet every 8 hours (45 MME/day) written by the "[facility]" MME/day: 45 mg/day   Medications ordered for procedure: Meds ordered this encounter  Medications   lidocaine (XYLOCAINE) 2 % (with pres) injection 400 mg   methylPREDNISolone acetate (DEPO-MEDROL) injection 80 mg   ropivacaine (PF) 2 mg/mL (0.2%) (NAROPIN) injection 9 mL    Medications administered: We administered lidocaine, methylPREDNISolone acetate, and ropivacaine (PF) 2 mg/mL (0.2%).  See the medical record for  exact dosing, route, and time of administration.  Follow-up plan:   Return in about 2 weeks (around 02/27/2021) for (VV) PM Proc-day (T,Th) (PPE).      Interventional Therapies  Risk  Complexity Considerations:   WNL   Planned  Pending:   Diagnostic bilateral SI joint block #2    Under consideration:   Diagnostic bilateral SI joint RFA #1    Completed:   Diagnostic bilateral lumbar facet block x2 (05/23/2020) (100/100/70/>50)  Therapeutic left lumbar facet RFA x1 (11/22/2018)  Diagnostic bilateral SI joint block x1 (10/08/2020) (100/100/100 x3 to 4 days/50)  Therapeutic left SI joint RFA x1 (11/22/2018)    Therapeutic  Palliative (PRN) options:   Palliative bilateral lumbar facet block #3  Palliative bilateral SI joint block #2         Recent Visits Date Type Provider Dept  02/05/21 Telemedicine Milinda Pointer, MD [facility]  01/15/21 Telemedicine Milinda Pointer, MD [facility]  12/23/20 Telemedicine Milinda Pointer, MD [facility]  12/05/20 Procedure visit Milinda Pointer, MD [facility]  11/21/20 Procedure visit Milinda Pointer, MD [facility]  Showing recent visits within past 90 days and meeting all other requirements Today's Visits Date Type Provider Dept  02/13/21 Procedure visit Milinda Pointer, MD [facility]  Showing today's visits and meeting all other requirements Future Appointments Date Type Provider Dept  03/04/21 Appointment Milinda Pointer, MD [facility]  Showing future appointments within next 90 days and meeting all other requirements Disposition: Discharge home  Discharge (Date  Time): 02/13/2021; 1139 hrs.   Primary Care Physician: Ferdie Ping, MD Location: [facility]  Outpatient [facility] Facility Note by: Gaspar Cola, MD Date: 02/13/2021; Time: 12:31 PM  Disclaimer:  Medicine is not an Chief Strategy Officer. The only guarantee in medicine is that  nothing is guaranteed. It is important to note that the decision to proceed with this intervention was based on the information collected from the patient. The Data and conclusions were drawn from the patient's questionnaire, the interview, and the physical examination. Because the information was provided in large part by the patient, it cannot be guaranteed that it has not been purposely or unconsciously manipulated. Every effort has been made to obtain as much relevant data as possible for this evaluation. It is important to note that the conclusions that lead to this procedure are derived in large part from the available data. Always take into account that the treatment will also be dependent on availability of resources and existing treatment guidelines, considered by other Pain Management Practitioners as being common knowledge and practice, at the time of the intervention. For Medico-Legal purposes, it is also important to point out that variation in procedural techniques and pharmacological choices are the acceptable norm. The indications, contraindications, technique, and results of the above procedure should only be interpreted and judged by a Board-Certified Interventional Pain Specialist with extensive familiarity and expertise in the same exact procedure and technique.

## 2021-02-14 ENCOUNTER — Telehealth: Payer: Self-pay

## 2021-02-14 NOTE — Telephone Encounter (Signed)
Post Procedure follow up.. Patient states he is doing good.

## 2021-03-02 NOTE — Progress Notes (Signed)
Unsuccessful attempt to contact patient for Virtual Visit (Pain Management Telehealth)   Patient provided contact information:  [phone] (home); [phone] (mobile); (Preferred) [phone] No e-mail address on record   Pre-screening:  Our staff was successful in contacting Albert Gordon using the above provided information.   I unsuccessfully attempted to make contact with Albert Gordon on 03/04/2021 via telephone. I was unable to complete the virtual encounter due to call going directly to voicemail. I was able to leave a message where I clearly identify myself as Gaspar Cola, MD and I left a message to call us back to reschedule the call.  Pharmacotherapy Assessment  Analgesic: No opioid analgesics prescribed by our practice. Morphine ER 15 mg daily (15 MME) + hydrocodone/APAP 10/325 1 tablet every 8 hours (45 MME/day) written by the "[facility] [employer] [facility]" MME/day: 45 mg/day   Follow-up plan:   Reschedule Visit.    Interventional Therapies  Risk  Complexity Considerations:   WNL   Planned  Pending:   Diagnostic bilateral SI joint block #2    Under consideration:   Diagnostic bilateral SI joint RFA #1    Completed:   Diagnostic bilateral lumbar facet block x2 (05/23/2020) (100/100/70/>50)  Therapeutic left lumbar facet RFA x1 (11/22/2018)  Diagnostic bilateral SI joint block x1 (10/08/2020) (100/100/100 x3 to 4 days/50)  Therapeutic left SI joint RFA x1 (11/22/2018)    Therapeutic  Palliative (PRN) options:   Palliative bilateral lumbar facet block #3  Palliative bilateral SI joint block #2          Recent Visits Date Type Provider Dept  02/13/21 Procedure visit Milinda Pointer, MD [facility]  02/05/21 Telemedicine Milinda Pointer, MD [facility]  01/15/21 Telemedicine Milinda Pointer, MD [facility]  12/23/20 Telemedicine Milinda Pointer, MD [facility]  12/05/20 Procedure visit Milinda Pointer, MD  [facility]  Showing recent visits within past 90 days and meeting all other requirements Today's Visits Date Type Provider Dept  03/04/21 Telemedicine Milinda Pointer, MD [facility]  Showing today's visits and meeting all other requirements Future Appointments No visits were found meeting these conditions. Showing future appointments within next 90 days and meeting all other requirements  Note by: Gaspar Cola, MD Date: 03/04/2021; Time: 5:02 PM

## 2021-03-03 ENCOUNTER — Telehealth: Payer: Self-pay

## 2021-03-03 NOTE — Telephone Encounter (Signed)
LM for patient to call office for pre virtual appointment questions.  

## 2021-03-04 ENCOUNTER — Other Ambulatory Visit: Payer: Self-pay

## 2021-03-04 ENCOUNTER — Ambulatory Visit: Payer: No Typology Code available for payment source | Attending: Pain Medicine | Admitting: Pain Medicine

## 2021-03-04 DIAGNOSIS — Z91199 Patient's noncompliance with other medical treatment and regimen due to unspecified reason: Secondary | ICD-10-CM

## 2021-03-04 DIAGNOSIS — Z5329 Procedure and treatment not carried out because of patient's decision for other reasons: Secondary | ICD-10-CM

## 2021-03-11 ENCOUNTER — Telehealth: Payer: Self-pay

## 2021-03-11 NOTE — Telephone Encounter (Signed)
Error

## 2021-03-31 NOTE — Progress Notes (Signed)
PROVIDER NOTE: Information contained herein reflects review and annotations entered in association with encounter. Interpretation of such information and data should be left to medically-trained personnel. Information provided to patient can be located elsewhere in the medical record under "Patient Instructions". Document created using STT-dictation technology, any transcriptional errors that may result from process are unintentional.    Patient: Albert Gordon  Service Category: E/M  Provider: Gaspar Cola, MD  DOB: Feb 19, 1946  DOS: 04/01/2021  Specialty: Interventional Pain Management  MRN: [id number]  Setting: Ambulatory outpatient  PCP: Ferdie Ping, MD  Type: Established Patient    Referring Provider: Ferdie Ping, MD  Location: Office  Delivery: Face-to-face     HPI  Mr. ISSAAC SHIPPER, a 75 y.o. year old male, is here today because of his Chronic bilateral low back pain without sciatica [M54.50, G89.29]. Mr. Pons primary complain today is Back Pain (Right sacro iliac joint pain going down the leg and causing weakness which ultimately caused him to fall and heel went to buttocks and now there is numbness in the right shin and calf ) Last encounter: My last encounter with him was on 02/13/2021. Pertinent problems: Mr. Naji has Chronic pain syndrome; Chronic low back pain (1ry area of Pain) (Bilateral) (L>R) w/o sciatica; Lumbar facet syndrome (Bilateral) (L>R); Chronic lower extremity pain (Bilateral) (L>R); Chronic hip pain (Left); Chronic sacroiliac joint pain (Bilateral) (L>R); Somatic dysfunction of sacroiliac joints (Bilateral); Spondylosis without myelopathy or radiculopathy, lumbosacral region; Lumbar facet arthropathy (Multilevel) (Bilateral); Lumbar facet hypertrophy (Multilevel) (Bilateral); DDD (degenerative disc disease), lumbar; Degenerative joint disease (DJD) of lumbar spine; Osteoarthritis of facet joint of lumbar spine; Osteoarthritis of lumbar spine; 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 Pain Assessment: Severity of Chronic pain is reported as a 5 /10. Location: Back Lower, Right/down right leg, calf is now numb after a fall. knee is a little sore.. Onset: More than a month ago. Quality: Discomfort, Constant, Numbness. Timing: Constant. Modifying factor(s): pain seems to be getting better after the fall.  there is still numbness in the calf but seems to be regaining strength in the right leg.. Vitals:  height is _0  (1.778 m) and weight is 170 lb (77.1 kg). His temporal temperature is 97.1 F (36.2 C) (abnormal). His blood pressure is 137/74 and his pulse is 83. His respiration is 16 and oxygen saturation is 97%.   Reason for encounter: post-procedure assessment.  On 02/13/2021 the patient underwent a diagnostic sacroiliac joint block.  We were scheduled to review the results on 03/04/2021 but we were unable to get in contact with the patient to complete the virtual visit.  He is now scheduled today to follow-up with that procedure as well asked to evaluate a "new pain".  The patient indicates that after his last procedure on 02/13/2021 he had complete relief of the low back pain for approximately 2 weeks.  However, as he was working on the yard, he suddenly experienced an episode of right lower extremity weakness where he went all the way down to the floor when his knee gave away.  After that  he continued to have pain and numbness below the knee in the area of the  calf and shin area, just lateral and distal to his knee.  He denies any pain or numbness into his foot.  During today's physical exam, he was able to toe walk and heel walk with some evidence of difficulty with his balance.  Hyperextension and rotation of his lumbar spine seem to reproduce his low back pain, but not the lower extremity pain.  Lateral bending of the spine did not trigger any radicular symptoms.  Straight leg raise was normal bilateral with full range of motion to 90 degrees and no reproduction of his leg symptoms.  Flexion of the lumbar spine was also within normal limits and he was able to reach the floor without any symptoms.  However, DTRs for the patellar tendon were completely absent on the right side and +2 on the left side.  He refers that since his initial event, the pain has subsided except in the right lower back around the area of the PSIS, but he has completely numbness of his shin area, just lateral and distal to his knee joint with some numbness over his right calf area as well.  Knee extension seems to be slightly weaker on the right side than on the left.  In view of the patient's pain, numbness, and weakness of the right lower extremity with complete absence of his right patellar reflex, I will be ordering an MRI of the lumbar spine for further evaluation.  Plain diagnostic x-rays of the lumbar spine show degenerative disc disease of the lumbosacral spine with decreased range of motion lumbar facet arthropathy and hypertrophy as well has a grade 1 anterolisthesis of L4 over L5.  X-rays done on flexion and extension demonstrated relative hypomobility at the L4-5 level with no abnormal angulatory or translatory motion.  Post-Procedure Evaluation  Procedure (02/13/2021):  Procedure:           Anesthesia, Analgesia, Anxiolysis:  Type: Diagnostic Sacroiliac Joint Steroid Injection          Region: Inferior Lumbosacral Region Level: PIIS (Posterior Inferior Iliac  Spine) Laterality: Bilateral   Type: Local Anesthesia Indication(s): Analgesia         Route: Infiltration ([place]/IM) IV Access: Declined Sedation: Declined  Local Anesthetic: Lidocaine 1-2%   Position: Prone            Indications: 1. Chronic sacroiliac joint pain (Bilateral) (L>R)   2. Somatic dysfunction of sacroiliac joints (Bilateral)   3. Other spondylosis, sacral and sacrococcygeal region   4. Enthesopathy of sacroiliac joint (Bilateral)     Pain Score: Pre-procedure: 5 /10 Post-procedure: 0-No pain/10   Anxiolysis: none.  Effectiveness during initial hour after procedure (Ultra-Short Term Relief): 100 %.  Local anesthetic used: Long-acting (4-6 hours) Effectiveness: Defined as any analgesic benefit obtained secondary to the administration of local anesthetics. This carries significant diagnostic value as to the etiological location, or anatomical origin, of the pain. Duration of benefit is expected to coincide with the duration of the local anesthetic used.  Effectiveness during initial 4-6 hours after procedure (Short-Term Relief): 100 %.  Long-term benefit: Defined as any relief past the pharmacologic duration of the local anesthetics.  Effectiveness past the initial 6 hours after procedure (Long-Term Relief): 50 % (ongoing).  Benefits, current: Defined as benefit present at the time of this evaluation.   Analgesia: 50% ongoing relief of the low back pain. Function: Mr. Haroon reports improvement in function ROM: Mr. Kimberlin reports  improvement in ROM  Pharmacotherapy Assessment  Analgesic: No opioid analgesics prescribed by our practice. Morphine ER 15 mg daily (15 MME) + hydrocodone/APAP 10/325 1 tablet every 8 hours (45 MME/day) written by the "[facility] [employer] [facility]" MME/day: 45 mg/day   Monitoring: [place] PMP: PDMP reviewed during this encounter.       Pharmacotherapy: No side-effects or adverse reactions reported. Compliance: No problems identified. Effectiveness:  Clinically acceptable.  Janett Billow, RN  04/01/2021 11:56 AM  Sign when Signing Visit Safety precautions to be maintained throughout the outpatient stay will include: orient to surroundings, keep bed in low position, maintain call bell within reach at all times, provide assistance with transfer out of bed and ambulation.     UDS:  Summary  Date Value Ref Range Status  01/29/2020 Note  Final    Comment:    ==================================================================== Compliance Drug Analysis, Ur ==================================================================== Test                             Result       Flag       Units  Drug Present and Declared for Prescription Verification   Morphine                       3126         EXPECTED   ng/mg creat   Normorphine                    100          EXPECTED   ng/mg creat    Potential sources of morphine include administration of codeine or    morphine, use of heroin, or ingestion of poppy seeds.     Normorphine is an expected metabolite of morphine.    Hydrocodone                    187          EXPECTED   ng/mg creat   Dihydrocodeine                 169          EXPECTED   ng/mg creat   Norhydrocodone                 3061         EXPECTED   ng/mg creat    Sources of hydrocodone include scheduled prescription medications.    Dihydrocodeine and norhydrocodone are expected metabolites of    hydrocodone. Dihydrocodeine is also available as a scheduled    prescription medication.    Acetaminophen                  PRESENT      EXPECTED ==================================================================== Test                      Result    Flag   Units      Ref Range   Creatinine              61               mg/dL      >=20 ==================================================================== Declared Medications:  The flagging and interpretation on this report are based on the  following declared medications.  Unexpected  results may arise from  inaccuracies in the declared medications.   **Note: The testing scope of  this panel includes these medications:   Hydrocodone ([place]co)  Morphine (MSIR)   **Note: The testing scope of this panel does not include small to  moderate amounts of these reported medications:   Acetaminophen (Norco)   **Note: The testing scope of this panel does not include the  following reported medications:   Atorvastatin (Lipitor)  Hydrochlorothiazide (Maxzide)  Tamsulosin (Flomax)  Terazosin (Hytrin)  Triamterene (Maxzide) ==================================================================== For clinical consultation, please call [phone]. ====================================================================      ROS  Constitutional: Denies any fever or chills Gastrointestinal: No reported hemesis, hematochezia, vomiting, or acute GI distress Musculoskeletal: Denies any acute onset joint swelling, redness, loss of ROM, or weakness Neurological: No reported episodes of acute onset apraxia, aphasia, dysarthria, agnosia, amnesia, paralysis, loss of coordination, or loss of consciousness  Medication Review  HYDROcodone-acetaminophen, Melatonin, atorvastatin, finasteride, morphine, predniSONE, rOPINIRole, salicyclic acid-sulfur, tamsulosin, terazosin, and triamterene-hydrochlorothiazide  History Review  Allergy: Mr. Parfait has No Known Allergies. Drug: Mr. Kauk  reports that he does not currently use drugs. Alcohol:  reports current alcohol use. Tobacco:  reports that he has quit smoking. He has quit using smokeless tobacco. Social: Mr. Cooler  reports that he has quit smoking. He has quit using smokeless tobacco. He reports current alcohol use. He reports that he does not currently use drugs. Medical:  has a past medical history of Hypertension. Surgical: Mr. Guin  has a past surgical history that includes Hernia repair. Family: family history is not on  file.  Laboratory Chemistry Profile   Renal Lab Results  Component Value Date   BUN 15 01/29/2020   CREATININE 0.93 01/29/2020   BCR 16 01/29/2020   GFRAA 93 01/29/2020   GFRNONAA 80 01/29/2020    Hepatic Lab Results  Component Value Date   AST 18 01/29/2020   ALBUMIN 4.4 01/29/2020   ALKPHOS 50 01/29/2020    Electrolytes Lab Results  Component Value Date   NA 142 01/29/2020   K 4.8 01/29/2020   CL 102 01/29/2020   CALCIUM 9.4 01/29/2020   MG 2.3 01/29/2020    Bone Lab Results  Component Value Date   25OHVITD1 54 01/29/2020   25OHVITD2 <1.0 01/29/2020   25OHVITD3 54 01/29/2020    Inflammation (CRP: Acute Phase) (ESR: Chronic Phase) Lab Results  Component Value Date   CRP <1 01/29/2020   ESRSEDRATE 2 01/29/2020         Note: Above Lab results reviewed.  Recent Imaging Review  DG PAIN CLINIC C-ARM 1-60 MIN NO REPORT Fluoro was used, but no Radiologist interpretation will be provided.  Please refer to "NOTES" tab for provider progress note. Note: Reviewed        Physical Exam  General appearance: Well nourished, well developed, and well hydrated. In no apparent acute distress Mental status: Alert, oriented x 3 (person, place, & time)       Respiratory: No evidence of acute respiratory distress Eyes: PERLA Vitals: BP 137/74 (BP Location: Right Arm, Patient Position: Sitting, Cuff Size: Normal)   Pulse 83   Temp (!) 97.1 F (36.2 C) (Temporal)   Resp 16   Ht _0  (1.778 m)   Wt 170 lb (77.1 kg)   SpO2 97%   BMI 24.39 kg/m  BMI: Estimated body mass index is 24.39 kg/m as calculated from the following:   Height as of this encounter: _1  (1.778 m).   Weight as of this encounter: 170 lb (77.1 kg). Ideal: Ideal body weight: 73 kg (160 lb 15 oz)  Adjusted ideal body weight: 74.6 kg (164 lb 9 oz)  Assessment   Status Diagnosis  Improving Stable Having a Flare-up 1. Chronic low back pain (1ry area of Pain) (Bilateral) (L>R) w/o sciatica   2. 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 Lumbosacral radiculopathy at L4 (Right)   4. Weakness of right leg   5. Right leg numbness   6. Acute pain of lower extremity (Right)   7. Absent right patellar reflex   8. Radicular pain of right lower extremity   9. Decreased range of motion of lumbar spine   10. DDD (degenerative disc disease), lumbar   11. Lumbar facet syndrome (Bilateral) (L>R)   12. Other intervertebral disc degeneration, lumbar region   13. Acute exacerbation of chronic low back pain   14. Encounter for chronic pain management      Updated Problems: Problem  Lumbosacral radiculopathy at L4 (Right)  Absent Right Patellar Reflex  Weakness of Right Leg  Radicular Pain of Right Lower Extremity  Acute pain of lower extremity (Right)  Right Leg Numbness    Plan of Care  Problem-specific:  No problem-specific Assessment & Plan notes found for this encounter.  Mr. BRASON BERTHELOT has a current medication list which includes the following long-term medication(s): melatonin, ropinirole, terazosin, and triamterene-hydrochlorothiazide.  Pharmacotherapy (Medications Ordered): Meds ordered this encounter  Medications   predniSONE (DELTASONE) 20 MG tablet    Sig: Take 3 tablets (60 mg total) by mouth daily with breakfast for 3 days, THEN 2 tablets (40 mg total) daily with breakfast for 3 days, THEN 1 tablet (20 mg total) daily with breakfast for 3 days.    Dispense:  18 tablet    Refill:  0    Orders:  Orders Placed This Encounter  Procedures   MR LUMBAR SPINE WO CONTRAST    Patient presents with axial pain with possible radicular component. Please assist [place] in identifying specific level(s) and laterality of any additional findings such as: 1. Facet (Zygapophyseal) joint DJD (Hypertrophy, space narrowing, subchondral sclerosis, and/or osteophyte formation) 2. DDD and/or IVDD (Loss of disc height, desiccation, gas patterns, osteophytes, endplate sclerosis, or "Black  disc disease") 3. Pars defects 4. Spondylolisthesis, spondylosis, and/or spondyloarthropathies (include Degree/Grade of displacement in mm) (stability) 5. Vertebral body Fractures (acute/chronic) (state percentage of collapse) 6. Demineralization (osteopenia/osteoporotic) 7. Bone pathology 8. Foraminal narrowing  9. Surgical changes 10. Central, Lateral Recess, and/or Foraminal Stenosis (include AP diameter of stenosis in mm) 11. Surgical changes (hardware type, status, and presence of fibrosis) 12. Modic Type Changes (MRI only) 13. IVDD (Disc bulge, protrusion, herniation, extrusion) (Level, laterality, extent)    Standing Status:   Future    Standing Expiration Date:   05/01/2021    Scheduling Instructions:     Imaging must be done as soon as possible. Inform patient that order will expire within 30 days and I will not renew it.    Order Specific Question:   What is the patient's sedation requirement?    Answer:   No Sedation    Order Specific Question:   Does the patient have a pacemaker or implanted devices?    Answer:   No    Order Specific Question:   Preferred imaging location?    Answer:   [facility] (table limit-350lbs)    Order Specific Question:   Call Results- Best Contact Number?    Answer:   [phone]) [phone] ([facility])    Order Specific Question:  Radiology Contrast Protocol - do NOT remove file path    Answer:   [email or web address]    Follow-up plan:   Return in about 2 weeks (around 04/15/2021) for Eval-day(M,W), (VV), for review of ordered tests and prednisone taper.     Interventional Therapies  Risk  Complexity Considerations:   WNL   Planned  Pending:   Diagnostic bilateral SI joint block #2    Under consideration:   Diagnostic bilateral SI joint RFA #1    Completed:   Diagnostic bilateral lumbar facet block x2 (05/23/2020) (100/100/70/>50)  Therapeutic left lumbar facet RFA x1 (11/22/2018)  Diagnostic bilateral  SI joint block x1 (10/08/2020) (100/100/100 x3 to 4 days/50)  Therapeutic left SI joint RFA x1 (11/22/2018)    Therapeutic  Palliative (PRN) options:   Palliative bilateral lumbar facet block #3  Palliative bilateral SI joint block #2           Recent Visits Date Type Provider Dept  03/04/21 Telemedicine Milinda Pointer, MD [facility]  02/13/21 Procedure visit Milinda Pointer, MD [facility]  02/05/21 Telemedicine Milinda Pointer, MD [facility]  01/15/21 Telemedicine Milinda Pointer, MD [facility]  Showing recent visits within past 90 days and meeting all other requirements Today's Visits Date Type Provider Dept  04/01/21 Office Visit Milinda Pointer, MD [facility]  Showing today's visits and meeting all other requirements Future Appointments No visits were found meeting these conditions. Showing future appointments within next 90 days and meeting all other requirements I discussed the assessment and treatment plan with the patient. The patient was provided an opportunity to ask questions and all were answered. The patient agreed with the plan and demonstrated an understanding of the instructions.  Patient advised to call back or seek an in-person evaluation if the symptoms or condition worsens.  Duration of encounter: 47 minutes.  Note by: Gaspar Cola, MD Date: 04/01/2021; Time: 12:51 PM

## 2021-04-01 ENCOUNTER — Ambulatory Visit: Payer: No Typology Code available for payment source | Attending: Pain Medicine | Admitting: Pain Medicine

## 2021-04-01 ENCOUNTER — Other Ambulatory Visit: Payer: Self-pay

## 2021-04-01 ENCOUNTER — Encounter: Payer: Self-pay | Admitting: Pain Medicine

## 2021-04-01 VITALS — BP 137/74 | HR 83 | Temp 97.1°F | Resp 16 | Ht 70.0 in | Wt 170.0 lb

## 2021-04-01 DIAGNOSIS — R29898 Other symptoms and signs involving the musculoskeletal system: Secondary | ICD-10-CM | POA: Diagnosis not present

## 2021-04-01 DIAGNOSIS — M545 Low back pain, unspecified: Secondary | ICD-10-CM | POA: Diagnosis present

## 2021-04-01 DIAGNOSIS — M47816 Spondylosis without myelopathy or radiculopathy, lumbar region: Secondary | ICD-10-CM | POA: Diagnosis present

## 2021-04-01 DIAGNOSIS — M5136 Other intervertebral disc degeneration, lumbar region: Secondary | ICD-10-CM | POA: Diagnosis present

## 2021-04-01 DIAGNOSIS — M4316 Spondylolisthesis, lumbar region: Secondary | ICD-10-CM | POA: Diagnosis not present

## 2021-04-01 DIAGNOSIS — R292 Abnormal reflex: Secondary | ICD-10-CM | POA: Insufficient documentation

## 2021-04-01 DIAGNOSIS — M5386 Other specified dorsopathies, lumbar region: Secondary | ICD-10-CM | POA: Diagnosis present

## 2021-04-01 DIAGNOSIS — M79604 Pain in right leg: Secondary | ICD-10-CM | POA: Diagnosis present

## 2021-04-01 DIAGNOSIS — G8929 Other chronic pain: Secondary | ICD-10-CM | POA: Diagnosis present

## 2021-04-01 DIAGNOSIS — M5410 Radiculopathy, site unspecified: Secondary | ICD-10-CM | POA: Diagnosis present

## 2021-04-01 DIAGNOSIS — M5417 Radiculopathy, lumbosacral region: Secondary | ICD-10-CM | POA: Insufficient documentation

## 2021-04-01 DIAGNOSIS — R200 Anesthesia of skin: Secondary | ICD-10-CM | POA: Diagnosis present

## 2021-04-01 MED ORDER — PREDNISONE 20 MG PO TABS: ORAL_TABLET | ORAL | 0 refills | Status: AC

## 2021-04-01 NOTE — Progress Notes (Signed)
Safety precautions to be maintained throughout the outpatient stay will include: orient to surroundings, keep bed in low position, maintain call bell within reach at all times, provide assistance with transfer out of bed and ambulation.  

## 2021-05-01 ENCOUNTER — Ambulatory Visit
Admission: RE | Admit: 2021-05-01 | Discharge: 2021-05-01 | Disposition: A | Discharge status: Home / Self Care | Payer: No Typology Code available for payment source | Source: Ambulatory Visit | Attending: Pain Medicine | Admitting: Pain Medicine

## 2021-05-01 ENCOUNTER — Other Ambulatory Visit: Payer: Self-pay

## 2021-05-01 DIAGNOSIS — R200 Anesthesia of skin: Secondary | ICD-10-CM | POA: Diagnosis present

## 2021-05-01 DIAGNOSIS — M5417 Radiculopathy, lumbosacral region: Secondary | ICD-10-CM

## 2021-05-01 DIAGNOSIS — M5386 Other specified dorsopathies, lumbar region: Secondary | ICD-10-CM | POA: Diagnosis present

## 2021-05-01 DIAGNOSIS — M5136 Other intervertebral disc degeneration, lumbar region: Secondary | ICD-10-CM | POA: Insufficient documentation

## 2021-05-01 DIAGNOSIS — M51369 Other intervertebral disc degeneration, lumbar region without mention of lumbar back pain or lower extremity pain: Secondary | ICD-10-CM

## 2021-05-01 DIAGNOSIS — R29898 Other symptoms and signs involving the musculoskeletal system: Secondary | ICD-10-CM

## 2021-05-01 DIAGNOSIS — M545 Low back pain, unspecified: Secondary | ICD-10-CM | POA: Diagnosis not present

## 2021-05-01 DIAGNOSIS — R292 Abnormal reflex: Secondary | ICD-10-CM

## 2021-05-01 DIAGNOSIS — M79604 Pain in right leg: Secondary | ICD-10-CM | POA: Diagnosis present

## 2021-05-01 DIAGNOSIS — M4316 Spondylolisthesis, lumbar region: Secondary | ICD-10-CM

## 2021-05-01 DIAGNOSIS — G8929 Other chronic pain: Secondary | ICD-10-CM

## 2021-05-01 DIAGNOSIS — M5410 Radiculopathy, site unspecified: Secondary | ICD-10-CM | POA: Diagnosis present

## 2021-05-01 DIAGNOSIS — M47816 Spondylosis without myelopathy or radiculopathy, lumbar region: Secondary | ICD-10-CM

## 2021-05-29 ENCOUNTER — Telehealth: Payer: Self-pay | Admitting: Pain Medicine

## 2021-05-29 NOTE — Telephone Encounter (Signed)
Msg left 05-22-21, did not show up on phones, found on 05-28-21  Patient is out of meds, not sure what meds, please call patient to verify.  Asking about an appt.  He had his MRI and is supposed to have been scheduled for follow up after that. Problem is I have no openings on a an eval day to put him in for until January. Please ask Dr. Dossie Arbour when he gets back if he can be added to a procedure day. Thank you

## 2021-05-29 NOTE — Telephone Encounter (Signed)
I would put him on the first virtual appointment opening.  Look on Nov 21.  Seems like there is some virtual appts open in the afternoon.

## 2021-06-05 ENCOUNTER — Ambulatory Visit
Admission: RE | Admit: 2021-06-05 | Discharge: 2021-06-05 | Disposition: A | Discharge status: Home / Self Care | Payer: No Typology Code available for payment source | Source: Ambulatory Visit | Attending: Pain Medicine | Admitting: Pain Medicine

## 2021-06-05 ENCOUNTER — Ambulatory Visit
Admission: RE | Admit: 2021-06-05 | Discharge: 2021-06-05 | Disposition: A | Discharge status: Home / Self Care | Payer: No Typology Code available for payment source | Attending: Pain Medicine | Admitting: Pain Medicine

## 2021-06-05 ENCOUNTER — Encounter: Payer: Self-pay | Admitting: Pain Medicine

## 2021-06-05 ENCOUNTER — Other Ambulatory Visit: Payer: Self-pay

## 2021-06-05 ENCOUNTER — Ambulatory Visit: Payer: No Typology Code available for payment source | Attending: Pain Medicine | Admitting: Pain Medicine

## 2021-06-05 VITALS — BP 119/64 | HR 72 | Temp 97.3°F | Resp 16 | Ht 70.0 in | Wt 168.0 lb

## 2021-06-05 DIAGNOSIS — M5417 Radiculopathy, lumbosacral region: Secondary | ICD-10-CM | POA: Diagnosis present

## 2021-06-05 DIAGNOSIS — M5441 Lumbago with sciatica, right side: Secondary | ICD-10-CM | POA: Diagnosis present

## 2021-06-05 DIAGNOSIS — M47816 Spondylosis without myelopathy or radiculopathy, lumbar region: Secondary | ICD-10-CM | POA: Diagnosis present

## 2021-06-05 DIAGNOSIS — M5136 Other intervertebral disc degeneration, lumbar region: Secondary | ICD-10-CM | POA: Insufficient documentation

## 2021-06-05 DIAGNOSIS — M542 Cervicalgia: Secondary | ICD-10-CM | POA: Diagnosis present

## 2021-06-05 DIAGNOSIS — M25512 Pain in left shoulder: Secondary | ICD-10-CM | POA: Insufficient documentation

## 2021-06-05 DIAGNOSIS — M4316 Spondylolisthesis, lumbar region: Secondary | ICD-10-CM

## 2021-06-05 DIAGNOSIS — M48061 Spinal stenosis, lumbar region without neurogenic claudication: Secondary | ICD-10-CM

## 2021-06-05 DIAGNOSIS — M79604 Pain in right leg: Secondary | ICD-10-CM

## 2021-06-05 DIAGNOSIS — M5126 Other intervertebral disc displacement, lumbar region: Secondary | ICD-10-CM | POA: Diagnosis present

## 2021-06-05 DIAGNOSIS — M51369 Other intervertebral disc degeneration, lumbar region without mention of lumbar back pain or lower extremity pain: Secondary | ICD-10-CM

## 2021-06-05 DIAGNOSIS — G8929 Other chronic pain: Secondary | ICD-10-CM | POA: Diagnosis present

## 2021-06-05 DIAGNOSIS — R937 Abnormal findings on diagnostic imaging of other parts of musculoskeletal system: Secondary | ICD-10-CM

## 2021-06-05 NOTE — Patient Instructions (Signed)
____________________________________________________________________________________________  Pain Prevention Technique  Definition:   A technique used to minimize the effects of an activity known to cause inflammation or swelling, which in turn leads to an increase in pain.  Purpose: To prevent swelling from occurring. It is based on the fact that it is easier to prevent swelling from happening than it is to get rid of it, once it occurs.  Contraindications: Anyone with allergy or hypersensitivity to the recommended medications. Anyone taking anticoagulants (Blood Thinners) (e.g., Coumadin, Warfarin, Plavix, etc.). Patients in Renal Failure.  Technique: Before you undertake an activity known to cause pain, or a flare-up of your chronic pain, and before you experience any pain, do the following:  On a full stomach, take 4 (four) over the counter Ibuprofens 200mg tablets (Motrin), for a total of 800 mg. In addition, take over the counter Magnesium 400 to 500 mg, before doing the activity.  Six (6) hours later, again on a full stomach, repeat the Ibuprofen. That night, take a warm shower and stretch under the running warm water.  This technique may be sufficient to abort the pain and discomfort before it happens. Keep in mind that it takes a lot less medication to prevent swelling than it takes to eliminate it once it occurs.  ____________________________________________________________________________________________   

## 2021-06-05 NOTE — Progress Notes (Signed)
Safety precautions to be maintained throughout the outpatient stay will include: orient to surroundings, keep bed in low position, maintain call bell within reach at all times, provide assistance with transfer out of bed and ambulation.  

## 2021-06-05 NOTE — Progress Notes (Signed)
PROVIDER NOTE: Information contained herein reflects review and annotations entered in association with encounter. Interpretation of such information and data should be left to medically-trained personnel. Information provided to patient can be located elsewhere in the medical record under "Patient Instructions". Document created using STT-dictation technology, any transcriptional errors that may result from process are unintentional.    Patient: Albert Gordon  Service Category: E/M  Provider: Gaspar Cola, MD  DOB: 04-23-1946  DOS: 06/05/2021  Specialty: Interventional Pain Management  MRN: [id number]  Setting: Ambulatory outpatient  PCP: Albert Ping, MD  Type: Established Patient    Referring Provider: Ferdie Ping, MD  Location: Office  Delivery: Face-to-face     HPI  [name], a 75 y.o. year old male, is here today because of his Lumbosacral radiculopathy at L4 [M54.17]. Mr. Bubolz primary complain today is Back Pain (Lumbar midline ), Shoulder Pain (Left ), and Neck Pain (Left ) Last encounter: My last encounter with him was on 05/29/2021. Pertinent problems: [name] has Chronic pain syndrome; Chronic low back pain (1ry area of Pain) (Bilateral) (L>R) w/o sciatica; Lumbar facet syndrome (Bilateral) (L>R); Chronic lower extremity pain (Bilateral) (L>R); Chronic hip pain (Left); Chronic sacroiliac joint pain (Bilateral) (L>R); Somatic dysfunction of sacroiliac joints (Bilateral); Spondylosis without myelopathy or radiculopathy, lumbosacral region; Lumbar facet arthropathy (Multilevel) (Bilateral); Lumbar facet hypertrophy (Multilevel) (Bilateral); DDD (degenerative disc disease), lumbar; Degenerative joint disease (DJD) of lumbar spine; Osteoarthritis of facet joint of lumbar spine; Osteoarthritis of lumbar spine; 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); Decreased range of motion of lumbar spine; Enthesopathy of sacroiliac joint (Bilateral); Chronic musculoskeletal  pain; Trochanteric bursitis, left hip; Other spondylosis, sacral and sacrococcygeal region; Chronic sacroiliac joint pain (Left); Restless leg syndrome; Lumbosacral radiculopathy at L4 (Right); Absent right patellar reflex; Weakness of right leg; Radicular pain of right lower extremity; Acute pain of lower extremity (Right); Right leg numbness; Abnormal MRI, lumbar spine (05/02/2021); Chronic low back pain (Bilateral) w/ sciatica (Right); 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 Pain Assessment: Severity of Chronic pain is reported as a 5 /10. Location: Back (shoulder and neck) Lower, Left, Right/from shoulder up into neck.. Onset: More than a month ago. Quality: Discomfort, Constant, Sharp, Dull. Timing: Constant. Modifying factor(s): heating pad. Vitals:  height is '5\' 10"'  (1.778 m) and weight is 168 lb (76.2 kg). His temporal temperature is 97.3 F (36.3 C) (abnormal). His blood pressure is 119/64 and his pulse is 72. His respiration is 16 and oxygen saturation is 98%.   Reason for encounter: follow-up evaluation after a lumbar MRI and a one-time prednisone taper.  The patient indicates that the prednisone taper did provide him with complete relief of the lower extremity pain.  Today he comes in with a new complaint of neck pain, and left shoulder pain.  On physical exam, he was able to maintain good range of motion of the neck and the shoulder but he did experience reproduction of the neck and shoulder pain upon lateral bending of the cervical spine towards the left side.  In addition, internal rotation of the left shoulder also reproduces shoulder pain.  Today's physical exam evaluation would suggest the patient to be having problems with both the cervical  spine and the shoulder joint.  To further look into this, today I  will be ordering x-rays of the cervical spine and the left shoulder.  On 12/23/2020 I made the following recommendations regarding his medications:  "Currently getting his medications from the [place].  Takes morphine ER 15 mg daily (15 MME) + hydrocodone/APAP 10/325 3 times daily (30 MME/day) for a total of 45 MME's.  Interested in getting some guidance as to how to taper his medications down to accomplish a "Drug Holiday".  I detailed how to do a slow taper to avoid withdrawals.    Recommend talking to the prescribing physician to switch him from the hydrocodone/APAP 10/325 to hydrocodone/APAP 5/325, for the purpose of tapering the opioids down.  Actual definition of breakthrough medicine is one that provides a total daily dose equivalent to no more than 10-15% of the total daily dose.   Once he has completed the "Drug Holiday", he should be reassessed for the need to go back on any opioid analgesic regimen.  If he feels that he can do without it, he should be kept on nonopioid therapies such as a combination of an NSAID, muscle relaxant and perhaps a membrane stabilizer.   Previously I had written a prescription for him on 06/06/2020 for Flexeril 5 mg, 1 to 2 tablets p.o. at bedtime, but he indicated that this actually made the restlessness worse.    He indicated taking buspirone for "restlessness" of his back.  He describes this as an inability to stay still secondary to a deep uncomfortable ache that he gets, mostly at bedtime.  He denied any sharp pain and he describes that the bilateral lumbar facet radiofrequency ablation did help considerably with his pain.  We talked about several options and we have decided to do a trial of melatonin 10 to 20 mg p.o. at bedtime and Requip 0.25 mg p.o. at bedtime, both of which are indicated for the treatment of restless leg syndrome.   He indicated having an appointment with the prescribing physician  for the opioid analgesics 12/24/2020."  On 04/01/2021 he was evaluated for a "new pain".  The patient indicated that after his last procedure on 02/13/2021 he had complete relief of the low back pain for approximately 2 weeks.  However, as he was working on the yard, he suddenly experienced an episode of right lower extremity weakness where he went all the way down to the floor when his knee gave away.  After that he continued to have pain and numbness below the knee in the area of the calf and shin area, just lateral and distal to his knee.  He denied any pain or numbness into his foot.   During the physical exam, he was able to toe walk and heel walk with some evidence of difficulty with his balance. Hyperextension and rotation of his lumbar spine seem to reproduce his low back pain, but not the lower extremity pain.  Lateral bending of the spine did not trigger any radicular symptoms.  Straight leg raise was normal bilateral with full range of motion to 90 degrees and no reproduction of his leg symptoms.  Flexion of the lumbar spine was also within normal limits and he was able to reach the floor without any symptoms.  However, DTRs for the patellar tendon were completely absent on the right side and +2 on the left side.   He refered that since his initial event, the pain had subsided except in the right lower back around the area of the PSIS, but he had complete numbness of his shin area,  just lateral and distal to his knee joint with some numbness over his right calf area as well.  Knee extension seems to be slightly weaker on the right side than on the left.   In view of the patient's pain, numbness, and weakness of the right lower extremity with complete absence of his right patellar reflex, I ordered an MRI of the lumbar spine for further evaluation.  Plain diagnostic x-rays of the lumbar spine showed degenerative disc disease of the lumbosacral spine with decreased range of motion lumbar facet  arthropathy and hypertrophy as well has a grade 1 anterolisthesis of L4 over L5.  X-rays done on flexion and extension demonstrated relative hypomobility at the L4-5 level with no abnormal angulatory or translatory motion.  (05/02/2021) LUMBAR MRI FINDINGS: Alignment: Grade 1 anterolisthesis present at L4-5. Vertebrae: 11 mm hemangioma is present anteriorly at L3.  DISC LEVELS: L2-3: Mild disc bulging is present. L3-4: Mild broad-based disc bulging is present. Mild facet hypertrophy is noted bilaterally. This results in mild bilateral foraminal narrowing. L4-5: Uncovering of a broad-based disc protrusion is present. Moderate facet hypertrophy is evident. Mild left subarticular narrowing is present. Mild foraminal narrowing is worse on the left. L5-S1: Mild disc bulging is present without significant stenosis.  IMPRESSION: 1. Mild bilateral foraminal narrowing at L3-4. 2. Mild left subarticular narrowing at L4-5. 3. Mild foraminal narrowing bilaterally at L4-5 is worse on the left. 4. Mild disc bulging at L2-3 and L5-S1 without significant stenosis.  Post-Procedure Evaluation  Procedure (12/05/2020(R) & 12/23/2020(L)):  Type: Thermal Lumbar Facet, Medial Branch & Sacroiliac joint Radiofrequency Ablation  #1  Region: Lumbosacral Level: L2, L3, L4, L5, S1, S2, & S3 Medial Branch Level(s). These levels will denervate the L3-4, L4-5, and the L5-S1 lumbar facet joints, as well as the posterior Sacroiliac Joint innervation. Primary Purpose: Therapeutic Region: Posterolateral Lumbosacral Spine Laterality: Bilateral   Type: Local Anesthesia Indication(s): Analgesia         Route: Infiltration ([place]/IM) IV Access: Declined Sedation: Declined  Local Anesthetic: Lidocaine 1-2%   Position: Prone    Indications: 1. Lumbar facet syndrome (Bilateral) (L>R)   2. Lumbar facet hypertrophy (Multilevel) (Bilateral)   3. Lumbar facet arthropathy (Multilevel) (Bilateral)   4. Grade 1 Anterolisthesis of  lumbar spine (L4/L5)   5. Spondylosis without myelopathy or radiculopathy, lumbosacral region   6. Osteoarthritis of facet joint of lumbar spine   7. Chronic sacroiliac joint pain (Bilateral) (L>R)   8. Enthesopathy of sacroiliac joint (Bilateral)   9. Other spondylosis, sacral and sacrococcygeal region   10. Somatic dysfunction of sacroiliac joints (Bilateral)   11. Degenerative joint disease (DJD) of lumbar spine   12. Decreased range of motion of lumbar spine     Mr. Meegan has been dealing with the above chronic pain for longer than three months and has either failed to respond, was unable to tolerate, or simply did not get enough benefit from other more conservative therapies including, but not limited to: 1. Over-the-counter medications 2. Anti-inflammatory medications 3. Muscle relaxants 4. Membrane stabilizers 5. Opioids 6. Physical therapy and/or chiropractic manipulation 7. Modalities (Heat, ice, etc.) 8. Invasive techniques such as nerve blocks. Mr. Sky has attained more than 50% relief of the pain from a series of diagnostic injections conducted in separate occasions.   Pain Score: Pre-procedure: 4 /10 Post-procedure: 0-No pain/10   Anxiolysis: Please see nurses note.  Effectiveness during initial hour after procedure (Ultra-Short Term Relief): 100 %.  Local anesthetic used: Long-acting (4-6 hours)  Effectiveness: Defined as any analgesic benefit obtained secondary to the administration of local anesthetics. This carries significant diagnostic value as to the etiological location, or anatomical origin, of the pain. Duration of benefit is expected to coincide with the duration of the local anesthetic used.  Effectiveness during initial 4-6 hours after procedure (Short-Term Relief): 100 %.  Long-term benefit: Defined as any relief past the pharmacologic duration of the local anesthetics.  Effectiveness past the initial 6 hours after procedure (Long-Term Relief): 100  %.  Benefits, current: Defined as benefit present at the time of this evaluation.   Analgesia: The patient continues to enjoy a 100% relief of the low back pain from the radiofrequency ablation of the lumbar facets. Function: Mr. Biehl reports improvement in function ROM: Mr. Hinderer reports improvement in ROM  Pharmacotherapy Assessment  Analgesic: No opioid analgesics prescribed by our practice. Morphine ER 15 mg daily (15 MME) + hydrocodone/APAP 10/325 1 tablet every 8 hours (45 MME/day) written by the [facility]" MME/day: 45 mg/day   Monitoring: [place] PMP: PDMP reviewed during this encounter.       Pharmacotherapy: No side-effects or adverse reactions reported. Compliance: No problems identified. Effectiveness: Clinically acceptable.  Janett Billow, RN  06/05/2021  2:44 PM  Sign when Signing Visit Safety precautions to be maintained throughout the outpatient stay will include: orient to surroundings, keep bed in low position, maintain call bell within reach at all times, provide assistance with transfer out of bed and ambulation.     UDS:  Summary  Date Value Ref Range Status  01/29/2020 Note  Final    Comment:    ==================================================================== Compliance Drug Analysis, Ur ==================================================================== Test                             Result       Flag       Units  Drug Present and Declared for Prescription Verification   Morphine                       3126         EXPECTED   ng/mg creat   Normorphine                    100          EXPECTED   ng/mg creat    Potential sources of morphine include administration of codeine or    morphine, use of heroin, or ingestion of poppy seeds.     Normorphine is an expected metabolite of morphine.    Hydrocodone                    187          EXPECTED   ng/mg creat   Dihydrocodeine                 169          EXPECTED   ng/mg creat    Norhydrocodone                 3061         EXPECTED   ng/mg creat    Sources of hydrocodone include scheduled prescription medications.    Dihydrocodeine and norhydrocodone are expected metabolites of    hydrocodone. Dihydrocodeine is also available as a scheduled    prescription medication.    Acetaminophen  PRESENT      EXPECTED ==================================================================== Test                      Result    Flag   Units      Ref Range   Creatinine              61               mg/dL      >=20 ==================================================================== Declared Medications:  The flagging and interpretation on this report are based on the  following declared medications.  Unexpected results may arise from  inaccuracies in the declared medications.   **Note: The testing scope of this panel includes these medications:   Hydrocodone (Norco)  Morphine (MSIR)   **Note: The testing scope of this panel does not include small to  moderate amounts of these reported medications:   Acetaminophen (Norco)   **Note: The testing scope of this panel does not include the  following reported medications:   Atorvastatin (Lipitor)  Hydrochlorothiazide (Maxzide)  Tamsulosin (Flomax)  Terazosin (Hytrin)  Triamterene (Maxzide) ==================================================================== For clinical consultation, please call [phone]. ====================================================================      ROS  Constitutional: Denies any fever or chills Gastrointestinal: No reported hemesis, hematochezia, vomiting, or acute GI distress Musculoskeletal: Denies any acute onset joint swelling, redness, loss of ROM, or weakness Neurological: No reported episodes of acute onset apraxia, aphasia, dysarthria, agnosia, amnesia, paralysis, loss of coordination, or loss of consciousness  Medication Review  HYDROcodone-acetaminophen,  Melatonin, atorvastatin, finasteride, morphine, rOPINIRole, salicyclic acid-sulfur, tamsulosin, terazosin, and triamterene-hydrochlorothiazide  History Review  Allergy: Mr. Heintzelman has No Known Allergies. Drug: Mr. Maraj  reports that he does not currently use drugs. Alcohol:  reports current alcohol use. Tobacco:  reports that he has quit smoking. He has quit using smokeless tobacco. Social: Mr. Blanck  reports that he has quit smoking. He has quit using smokeless tobacco. He reports current alcohol use. He reports that he does not currently use drugs. Medical:  has a past medical history of Hypertension. Surgical: Mr. Cathey  has a past surgical history that includes Hernia repair. Family: family history is not on file.  Laboratory Chemistry Profile   Renal Lab Results  Component Value Date   BUN 15 01/29/2020   CREATININE 0.93 01/29/2020   BCR 16 01/29/2020   GFRAA 93 01/29/2020   GFRNONAA 80 01/29/2020    Hepatic Lab Results  Component Value Date   AST 18 01/29/2020   ALBUMIN 4.4 01/29/2020   ALKPHOS 50 01/29/2020    Electrolytes Lab Results  Component Value Date   NA 142 01/29/2020   K 4.8 01/29/2020   CL 102 01/29/2020   CALCIUM 9.4 01/29/2020   MG 2.3 01/29/2020    Bone Lab Results  Component Value Date   25OHVITD1 54 01/29/2020   25OHVITD2 <1.0 01/29/2020   25OHVITD3 54 01/29/2020    Inflammation (CRP: Acute Phase) (ESR: Chronic Phase) Lab Results  Component Value Date   CRP <1 01/29/2020   ESRSEDRATE 2 01/29/2020         Note: Above Lab results reviewed.  Recent Imaging Review  MR LUMBAR SPINE WO CONTRAST CLINICAL DATA:  Chronic bilateral low back pain. Patient reports right lower extremity radicular symptoms with numbness and tingling for 3 weeks.  EXAM: MRI LUMBAR SPINE WITHOUT CONTRAST  TECHNIQUE: Multiplanar, multisequence MR imaging of the lumbar spine was performed. No intravenous contrast was administered.  COMPARISON:   None.  FINDINGS:  Segmentation: 5 non rib-bearing lumbar type vertebral bodies are present. The lowest fully formed vertebral body is L5.  Alignment: Grade 1 anterolisthesis present at L4-5. No other significant listhesis is present. Straightening of the normal lumbar lordosis is noted.  Vertebrae: 11 mm hemangioma is present anteriorly at L3. Marrow signal and vertebral body heights are otherwise normal.  Conus medullaris and cauda equina: Conus extends to the L1 level. Conus and cauda equina appear normal.  Paraspinal and other soft tissues: Limited imaging the abdomen is unremarkable. There is no significant adenopathy. No solid organ lesions are present.  Disc levels:  L1-2: Negative.  L2-3: Mild disc bulging is present. No significant stenosis is present.  L3-4: Mild broad-based disc bulging is present. Mild facet hypertrophy is noted bilaterally. This results in mild bilateral foraminal narrowing.  L4-5: Uncovering of a broad-based disc protrusion is present. Moderate facet hypertrophy is evident. Mild left subarticular narrowing is present. Mild foraminal narrowing is worse on the left.  L5-S1: Mild disc bulging is present without significant stenosis.  IMPRESSION: 1. Mild bilateral foraminal narrowing at L3-4. 2. Mild left subarticular narrowing at L4-5. 3. Mild foraminal narrowing bilaterally at L4-5 is worse on the left. 4. Mild disc bulging at L2-3 and L5-S1 without significant stenosis.  Electronically Signed   By: San Morelle M.D.   On: 05/02/2021 18:05 Note: Reviewed        Physical Exam  General appearance: Well nourished, well developed, and well hydrated. In no apparent acute distress Mental status: Alert, oriented x 3 (person, place, & time)       Respiratory: No evidence of acute respiratory distress Eyes: PERLA Vitals: BP 119/64 (BP Location: Right Arm, Patient Position: Sitting, Cuff Size: Normal)   Pulse 72   Temp (!) 97.3 F (36.3  C) (Temporal)   Resp 16   Ht '5\' 10"'  (1.778 m)   Wt 168 lb (76.2 kg)   SpO2 98%   BMI 24.11 kg/m  BMI: Estimated body mass index is 24.11 kg/m as calculated from the following:   Height as of this encounter: '5\' 10"'  (1.778 m).   Weight as of this encounter: 168 lb (76.2 kg). Ideal: Ideal body weight: 73 kg (160 lb 15 oz) Adjusted ideal body weight: 74.3 kg (163 lb 12.2 oz)  Assessment   Status Diagnosis  Improved Resolved New problem New problem 1. Lumbosacral radiculopathy at L4 (Right)   2. Acute pain of lower extremity (Right)   3. Cervicalgia   4. Chronic left shoulder pain   5. 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 Lumbar lateral recess stenosis (L4-5) (Left)   8. Chronic low back pain (Bilateral) w/ sciatica (Right)   9. Abnormal MRI, lumbar spine (05/02/2021)   10. DDD (degenerative disc disease), lumbar   11. Protrusion of lumbar intervertebral disc (L4-5)   12. Bulging of lumbar intervertebral disc without myelopathy (L2-3, L5-S1)   13. Lumbar facet arthropathy (Multilevel) (Bilateral)   14. Lumbar facet hypertrophy (Multilevel) (Bilateral)   15. Lumbar facet syndrome (Bilateral) (L>R)      Updated Problems: Problem  Abnormal MRI, lumbar spine (05/02/2021)   (05/02/2021) LUMBAR MRI FINDINGS: Alignment: Grade 1 anterolisthesis present at L4-5. Vertebrae: 11 mm hemangioma is present anteriorly at L3.  DISC LEVELS: L2-3: Mild disc bulging is present. L3-4: Mild broad-based disc bulging is present. Mild facet hypertrophy is noted bilaterally. This results in mild bilateral foraminal narrowing. L4-5: Uncovering of  a broad-based disc protrusion is present. Moderate facet hypertrophy is evident. Mild left subarticular narrowing is present. Mild foraminal narrowing is worse on the left. L5-S1: Mild disc bulging is present without significant stenosis.  IMPRESSION: 1. Mild bilateral foraminal  narrowing at L3-4. 2. Mild left subarticular narrowing at L4-5. 3. Mild foraminal narrowing bilaterally at L4-5 is worse on the left. 4. Mild disc bulging at L2-3 and L5-S1 without significant stenosis.   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  Mr. ZACKERY BRINE has a current medication list which includes the following long-term medication(s): ropinirole, triamterene-hydrochlorothiazide, melatonin, and terazosin.  Pharmacotherapy (Medications Ordered): No orders of the defined types were placed in this encounter.  Orders:  Orders Placed This Encounter  Procedures   DG Cervical Spine Complete    Patient presents with axial pain with possible radicular component. Please assist [place] in identifying specific level(s) and laterality of any additional findings such as: 1. Facet (Zygapophyseal) joint DJD (Hypertrophy, space narrowing, subchondral sclerosis, and/or osteophyte formation) 2. DDD and/or IVDD (Loss of disc height, desiccation, gas patterns, osteophytes, endplate sclerosis, or "Black disc disease") 3. Pars defects 4. Spondylolisthesis, spondylosis, and/or spondyloarthropathies (include Degree/Grade of displacement in mm) (stability) 5. Vertebral body Fractures (acute/chronic) (state percentage of collapse) 6. Demineralization (osteopenia/osteoporotic) 7. Bone pathology 8. Foraminal narrowing  9. Surgical changes    Standing Status:   Future    Number of Occurrences:   1    Standing Expiration Date:   09/05/2021    Scheduling Instructions:     Please contact  patient and remind him that the order has a limited expiration date and that radiology needs time to read the study.    Order Specific Question:   Reason for Exam (SYMPTOM  OR DIAGNOSIS REQUIRED)    Answer:   Cervicalgia    Order Specific Question:   Preferred imaging location?    Answer:   [facility]    Order Specific Question:   Call Results- Best Contact Number?    Answer:   [phone]   DG Shoulder Left    Standing Status:   Future    Number of Occurrences:   1    Standing Expiration Date:   07/05/2021    Scheduling Instructions:     Imaging must be done as soon as possible. Inform patient that order will expire within 30 days and I will not renew it.    Order Specific Question:   Reason for Exam (SYMPTOM  OR DIAGNOSIS REQUIRED)    Answer:   Left shoulder pain    Order Specific Question:   Preferred imaging location?    Answer:   [facility]    Order Specific Question:   Call Results- Best Contact Number?    Answer:   [phone] ([facility])    Order Specific Question:   Release to patient    Answer:   Immediate    Follow-up plan:   Return in about 1 week (around 06/12/2021) for Eval-day (M,W), (VV), for review of ordered x-ray tests (C-spine & L-Shoulder).      Interventional Therapies  Risk  Complexity  Considerations:   Estimated body mass index is 24.11 kg/m as calculated from the following:   Height as of this encounter: '5\' 10"'  (1.778 m).   Weight as of this encounter: 168 lb (76.2 kg). WNL   Planned  Pending:   Diagnostic/therapeutic right L3 and L4 TFESI #1    Under consideration:   Diagnostic/therapeutic right L3 and L4 TFESI #1    Completed:   Diagnostic bilateral lumbar facet MBB x2 (05/23/2020) (100/100/70/>50)  Diagnostic bilateral SI joint Blk x2 (02/13/2021) (100/100/100 x3 to 4 days/50)  Therapeutic left lumbar facet RFA x1 (11/21/2020) (100/100/100/100)  Therapeutic left SI joint RFA x1 (11/21/2020) (100/100/100/100)  Therapeutic  right lumbar facet RFA x1 (12/05/2020) (100/100/100/100)  Therapeutic right SI joint RFA x1 (12/05/2020) (100/100/100/100)    Therapeutic  Palliative (PRN) options:   Palliative bilateral lumbar facet RFA #2  Palliative bilateral SI joint RFA #2     Recent Visits Date Type Provider Dept  04/01/21 Office Visit Milinda Pointer, MD [facility]  Showing recent visits within past 90 days and meeting all other requirements Today's Visits Date Type Provider Dept  06/05/21 Office Visit Milinda Pointer, MD [facility]  Showing today's visits and meeting all other requirements Future Appointments Date Type Provider Dept  06/11/21 Appointment Milinda Pointer, MD [facility]  Showing future appointments within next 90 days and meeting all other requirements I discussed the assessment and treatment plan with the patient. The patient was provided an opportunity to ask questions and all were answered. The patient agreed with the plan and demonstrated an understanding of the instructions.  Patient advised to call back or seek an in-person evaluation if the symptoms or condition worsens.  Duration of encounter: 45 minutes.  Note by: Gaspar Cola, MD Date: 06/05/2021; Time: 5:05 PM

## 2021-06-11 ENCOUNTER — Other Ambulatory Visit: Payer: Self-pay

## 2021-06-11 ENCOUNTER — Ambulatory Visit: Payer: No Typology Code available for payment source | Attending: Pain Medicine | Admitting: Pain Medicine

## 2021-06-11 ENCOUNTER — Telehealth: Payer: No Typology Code available for payment source | Admitting: Pain Medicine

## 2021-06-11 DIAGNOSIS — M47812 Spondylosis without myelopathy or radiculopathy, cervical region: Secondary | ICD-10-CM

## 2021-06-11 DIAGNOSIS — M542 Cervicalgia: Secondary | ICD-10-CM | POA: Diagnosis not present

## 2021-06-11 DIAGNOSIS — M25512 Pain in left shoulder: Secondary | ICD-10-CM

## 2021-06-11 DIAGNOSIS — G8929 Other chronic pain: Secondary | ICD-10-CM

## 2021-06-11 NOTE — Patient Instructions (Signed)
______________________________________________________________________  Preparing for Procedure with Sedation  NOTICE: Due to recent regulatory changes, starting on February 17, 2021, procedures requiring intravenous (IV) sedation will no longer be performed at the [facility].  These types of procedures are required to be performed at [facility] ambulatory surgery facility.  We are very sorry for the inconvenience.  Procedure appointments are limited to planned procedures: No Prescription Refills. No disability issues will be discussed. No medication changes will be discussed.  Instructions: Oral Intake: Do not eat or drink anything for at least 8 hours prior to your procedure. (Exception: Blood Pressure Medication. See below.) Transportation: A driver is required. You may not drive yourself after the procedure. Blood Pressure Medicine: Do not forget to take your blood pressure medicine with a sip of water the morning of the procedure. If your Diastolic (lower reading) is above 100 mmHg, elective cases will be cancelled/rescheduled. Blood thinners: These will need to be stopped for procedures. Notify our staff if you are taking any blood thinners. Depending on which one you take, there will be specific instructions on how and when to stop it. Diabetics on insulin: Notify the staff so that you can be scheduled 1st case in the morning. If your diabetes requires high dose insulin, take only  of your normal insulin dose the morning of the procedure and notify the staff that you have done so. Preventing infections: Shower with an antibacterial soap the morning of your procedure. Build-up your immune system: Take 1000 mg of Vitamin C with every meal (3 times a day) the day prior to your procedure. Antibiotics: Inform the staff if you have a condition or reason that requires you to take antibiotics before dental procedures. Pregnancy: If you are pregnant, call and cancel the procedure. Sickness: If  you have a cold, fever, or any active infections, call and cancel the procedure. Arrival: You must be in the facility at least 30 minutes prior to your scheduled procedure. Children: Do not bring children with you. Dress appropriately: Bring dark clothing that you would not mind if they get stained. Valuables: Do not bring any jewelry or valuables.  Reasons to call and reschedule or cancel your procedure: (Following these recommendations will minimize the risk of a serious complication.) Surgeries: Avoid having procedures within 2 weeks of any surgery. (Avoid for 2 weeks before or after any surgery). Flu Shots: Avoid having procedures within 2 weeks of a flu shots. (Avoid for 2 weeks before or after immunizations). Barium: Avoid having a procedure within 7-10 days after having had a radiological study involving the use of radiological contrast. (Myelograms, Barium swallow or enema study). Heart attacks: Avoid any elective procedures or surgeries for the initial 6 months after a "Myocardial Infarction" (Heart Attack). Blood thinners: It is imperative that you stop these medications before procedures. Let us know if you if you take any blood thinner.  Infection: Avoid procedures during or within two weeks of an infection (including chest colds or gastrointestinal problems). Symptoms associated with infections include: Localized redness, fever, chills, night sweats or profuse sweating, burning sensation when voiding, cough, congestion, stuffiness, runny nose, sore throat, diarrhea, nausea, vomiting, cold or Flu symptoms, recent or current infections. It is specially important if the infection is over the area that we intend to treat. Heart and lung problems: Symptoms that may suggest an active cardiopulmonary problem include: cough, chest pain, breathing difficulties or shortness of breath, dizziness, ankle swelling, uncontrolled high or unusually low blood pressure, and/or palpitations. If you are    experiencing any of these symptoms, cancel your procedure and contact your primary care physician for an evaluation.  Remember:  Regular Business hours are:  Monday to Thursday 8:00 AM to 4:00 PM  Provider's Schedule: Yazleen Molock, MD:  Procedure days: Tuesday and Thursday 7:30 AM to 4:00 PM  Bilal Lateef, MD:  Procedure days: Monday and Wednesday 7:30 AM to 4:00 PM ______________________________________________________________________  ____________________________________________________________________________________________  General Risks and Possible Complications  Patient Responsibilities: It is important that you read this as it is part of your informed consent. It is our duty to inform you of the risks and possible complications associated with treatments offered to you. It is your responsibility as a patient to read this and to ask questions about anything that is not clear or that you believe was not covered in this document.  Patient's Rights: You have the right to refuse treatment. You also have the right to change your mind, even after initially having agreed to have the treatment done. However, under this last option, if you wait until the last second to change your mind, you may be charged for the materials used up to that point.  Introduction: Medicine is not an exact science. Everything in Medicine, including the lack of treatment(s), carries the potential for danger, harm, or loss (which is by definition: Risk). In Medicine, a complication is a secondary problem, condition, or disease that can aggravate an already existing one. All treatments carry the risk of possible complications. The fact that a side effects or complications occurs, does not imply that the treatment was conducted incorrectly. It must be clearly understood that these can happen even when everything is done following the highest safety standards.  No treatment: You can choose not to proceed with the  proposed treatment alternative. The "PRO(s)" would include: avoiding the risk of complications associated with the therapy. The "CON(s)" would include: not getting any of the treatment benefits. These benefits fall under one of three categories: diagnostic; therapeutic; and/or palliative. Diagnostic benefits include: getting information which can ultimately lead to improvement of the disease or symptom(s). Therapeutic benefits are those associated with the successful treatment of the disease. Finally, palliative benefits are those related to the decrease of the primary symptoms, without necessarily curing the condition (example: decreasing the pain from a flare-up of a chronic condition, such as incurable terminal cancer).  General Risks and Complications: These are associated to most interventional treatments. They can occur alone, or in combination. They fall under one of the following six (6) categories: no benefit or worsening of symptoms; bleeding; infection; nerve damage; allergic reactions; and/or death. No benefits or worsening of symptoms: In Medicine there are no guarantees, only probabilities. No healthcare provider can ever guarantee that a medical treatment will work, they can only state the probability that it may. Furthermore, there is always the possibility that the condition may worsen, either directly, or indirectly, as a consequence of the treatment. Bleeding: This is more common if the patient is taking a blood thinner, either prescription or over the counter (example: Goody Powders, Fish oil, Aspirin, Garlic, etc.), or if suffering a condition associated with impaired coagulation (example: Hemophilia, cirrhosis of the liver, low platelet counts, etc.). However, even if you do not have one on these, it can still happen. If you have any of these conditions, or take one of these drugs, make sure to notify your treating physician. Infection: This is more common in patients with a compromised  immune system, either due to disease (example:   diabetes, cancer, human immunodeficiency virus [HIV], etc.), or due to medications or treatments (example: therapies used to treat cancer and rheumatological diseases). However, even if you do not have one on these, it can still happen. If you have any of these conditions, or take one of these drugs, make sure to notify your treating physician. Nerve Damage: This is more common when the treatment is an invasive one, but it can also happen with the use of medications, such as those used in the treatment of cancer. The damage can occur to small secondary nerves, or to large primary ones, such as those in the spinal cord and brain. This damage may be temporary or permanent and it may lead to impairments that can range from temporary numbness to permanent paralysis and/or brain death. Allergic Reactions: Any time a substance or material comes in contact with our body, there is the possibility of an allergic reaction. These can range from a mild skin rash (contact dermatitis) to a severe systemic reaction (anaphylactic reaction), which can result in death. Death: In general, any medical intervention can result in death, most of the time due to an unforeseen complication. ____________________________________________________________________________________________  

## 2021-06-11 NOTE — Progress Notes (Signed)
Patient: Albert Gordon  Service Category: E/M  Provider: Gaspar Cola, MD  DOB: 01-14-1946  DOS: 06/11/2021  Location: Office  MRN: [id number]  Setting: Ambulatory outpatient  Referring Provider: Ferdie Ping, MD  Type: Established Patient  Specialty: Interventional Pain Management  PCP: Ferdie Ping, MD  Location: Remote location  Delivery: TeleHealth     Virtual Encounter - Pain Management PROVIDER NOTE: Information contained herein reflects review and annotations entered in association with encounter. Interpretation of such information and data should be left to medically-trained personnel. Information provided to patient can be located elsewhere in the medical record under "Patient Instructions". Document created using STT-dictation technology, any transcriptional errors that may result from process are unintentional.    Contact & Pharmacy Preferred: [phone] Home: [phone] (home) Mobile: [phone] (mobile) E-mail: No e-mail address on record  [id number] - [place], [place] [place] [address] Phone: [phone] Fax: [phone]   Pre-screening  Albert Gordon offered "in-person" vs "virtual" encounter. He indicated preferring virtual for this encounter.   Reason COVID-19*  Social distancing based on CDC and AMA recommendations.   I contacted Albert Gordon on 06/11/2021 via telephone.      I clearly identified myself as Gaspar Cola, MD. I verified that I was speaking with the correct person using two identifiers (Name: Albert Gordon, and date of birth: Jun 12, 1976).  Consent I sought verbal advanced consent from Albert Gordon for virtual visit interactions. I informed Albert Gordon of possible security and privacy concerns, risks, and limitations associated with providing "not-in-person" medical evaluation and management services. I also informed Albert Gordon of the availability of "in-person" appointments. Finally, I  informed him that there would be a charge for the virtual visit and that he could be  personally, fully or partially, financially responsible for it. Albert Gordon expressed understanding and agreed to proceed.   Historic Elements   Albert Gordon is a 75 y.o. year old, male patient evaluated today after our last contact on 06/05/2021. Albert Gordon  has a past medical history of Hypertension. He also  has a past surgical history that includes Hernia repair. Albert Gordon has a current medication list which includes the following prescription(s): atorvastatin, finasteride, hydrocodone-acetaminophen, melatonin, morphine, ropinirole, salicyclic acid-sulfur, tamsulosin, and triamterene-hydrochlorothiazide. He  reports that he has quit smoking. He has quit using smokeless tobacco. He reports current alcohol use. He reports that he does not currently use drugs. Albert Gordon has No Known Allergies.   HPI  Today, he is being contacted for follow-up evaluation after having had x-rays of the cervical spine and left shoulder.  According to the x-rays of the cervical spine he has a 2 mm anterolisthesis of C5 over C6 and C7 over T1. He has some disc space narrowing at C6-7 and prominent multilevel facet joint hypertrophy that seems to be more prominent on the left side.  There is also evidence of bony neuroforaminal narrowing at C5-6 and C6-7 on the left and at C5-6 on the right.  On 06/05/2021 he was complaining of neck pain and shoulder pain that seem to be worse on the left side.  In the case of the x-rays of the left shoulder it describes minimal osteoarthritis with acromioclavicular spurring and inferior glenoid spurring.  Today I went over the results of both of these x-rays in great detail and I have explained to him that it is very likely that what we are dealing  with is a cervical facet syndrome, similar to what we have been dealing with in terms of the lower back.  I also explained to him that the treatment would be  similar with 2 diagnostic injections and if the pain continues to come back then we would have to consider doing radiofrequency.  He understood and accepted and he indicated wanting to proceed with the first diagnostic injection.  Pharmacotherapy Assessment   Analgesic: No opioid analgesics prescribed by our practice. Morphine ER 15 mg daily (15 MME) + hydrocodone/APAP 10/325 1 tablet every 8 hours (45 MME/day) written by the "[employer]" MME/day: 45 mg/day   Monitoring: [place] PMP: PDMP not reviewed this encounter.       Pharmacotherapy: No side-effects or adverse reactions reported. Compliance: No problems identified. Effectiveness: Clinically acceptable. Plan: Refer to "POC". UDS:  Summary  Date Value Ref Range Status  01/29/2020 Note  Final    Comment:    ==================================================================== Compliance Drug Analysis, Ur ==================================================================== Test                             Result       Flag       Units  Drug Present and Declared for Prescription Verification   Morphine                       3126         EXPECTED   ng/mg creat   Normorphine                    100          EXPECTED   ng/mg creat    Potential sources of morphine include administration of codeine or    morphine, use of heroin, or ingestion of poppy seeds.     Normorphine is an expected metabolite of morphine.    Hydrocodone                    187          EXPECTED   ng/mg creat   Dihydrocodeine                 169          EXPECTED   ng/mg creat   Norhydrocodone                 3061         EXPECTED   ng/mg creat    Sources of hydrocodone include scheduled prescription medications.    Dihydrocodeine and norhydrocodone are expected metabolites of    hydrocodone. Dihydrocodeine is also available as a scheduled    prescription medication.    Acetaminophen                  PRESENT       EXPECTED ==================================================================== Test                      Result    Flag   Units      Ref Range   Creatinine              61               mg/dL      >=20 ==================================================================== Declared Medications:  The flagging and interpretation on this report are based on the  following declared medications.  Unexpected results  may arise from  inaccuracies in the declared medications.   **Note: The testing scope of this panel includes these medications:   Hydrocodone (Norco)  Morphine (MSIR)   **Note: The testing scope of this panel does not include small to  moderate amounts of these reported medications:   Acetaminophen (Norco)   **Note: The testing scope of this panel does not include the  following reported medications:   Atorvastatin (Lipitor)  Hydrochlorothiazide (Maxzide)  Tamsulosin (Flomax)  Terazosin (Hytrin)  Triamterene (Maxzide) ==================================================================== For clinical consultation, please call [phone]. ====================================================================      Laboratory Chemistry Profile   Renal Lab Results  Component Value Date   BUN 15 01/29/2020   CREATININE 0.93 01/29/2020   BCR 16 01/29/2020   GFRAA 93 01/29/2020   GFRNONAA 80 01/29/2020    Hepatic Lab Results  Component Value Date   AST 18 01/29/2020   ALBUMIN 4.4 01/29/2020   ALKPHOS 50 01/29/2020    Electrolytes Lab Results  Component Value Date   NA 142 01/29/2020   K 4.8 01/29/2020   CL 102 01/29/2020   CALCIUM 9.4 01/29/2020   MG 2.3 01/29/2020    Bone Lab Results  Component Value Date   25OHVITD1 54 01/29/2020   25OHVITD2 <1.0 01/29/2020   25OHVITD3 54 01/29/2020    Inflammation (CRP: Acute Phase) (ESR: Chronic Phase) Lab Results  Component Value Date   CRP <1 01/29/2020   ESRSEDRATE 2 01/29/2020         Note: Above Lab  results reviewed.  Imaging  DG Cervical Spine Complete CLINICAL DATA:  Cervicalgia. Chronic neck pain radiating into left shoulder.  EXAM: CERVICAL SPINE - COMPLETE 4+ VIEW  COMPARISON:  None.  FINDINGS: 2 mm anterolisthesis of C5 on C6 and C7 on T1. Vertebral body heights are normal. Disc space narrowing at C6-C7 with endplate spurring. There is prominent multilevel facet hypertrophy, more prominent on the left. Bony neural foraminal narrowing at C5-C6 and C6-C7 on the left, C5-C6 on the right. There is no evidence of fracture, focal bone lesion or bone destruction. No prevertebral soft tissue thickening. Lung apices are clear.  IMPRESSION: 1. Prominent multilevel facet hypertrophy. 2. 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 3. Bony neural foraminal narrowing at C5-C6 and C6-C7 on the left, and at C5-C6 on the right.  Electronically Signed   By: Keith Rake M.D.   On: 06/06/2021 00:04 DG Shoulder Left CLINICAL DATA:  Left shoulder pain. Chronic neck pain radiating to the left shoulder.  EXAM: LEFT SHOULDER - 2+ VIEW  COMPARISON:  None.  FINDINGS: There is no evidence of fracture or dislocation. Trace acromioclavicular spurring. Minimal inferior glenoid spurring. No erosion, avascular necrosis or focal bone abnormality. Soft tissues are unremarkable. No soft tissue calcifications.  IMPRESSION: Minimal osteoarthritis of the left shoulder.  Electronically Signed   By: Keith Rake M.D.   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  Plan of Care  Problem-specific:  No problem-specific Assessment & Plan notes found for this encounter.  Albert Gordon has a current  medication list which includes the following long-term medication(s): melatonin, ropinirole, and triamterene-hydrochlorothiazide.  Pharmacotherapy (Medications Ordered): No orders of the defined types were placed in this encounter.  Orders:  Orders Placed This Encounter  Procedures   CERVICAL  FACET (MEDIAL BRANCH NERVE BLOCK)     Standing Status:   Future    Standing Expiration Date:   09/11/2021    Scheduling Instructions:     Side: Bilateral     Level: C3-4, C4-5, C5-6 Facet joints (C3, C4, C5, C6, & C7 Medial Branch Nerves)     Sedation: Patient's choice.     Timeframe: As soon as schedule allows    Order Specific Question:   Where will this procedure be performed?    Answer:   [facility]    Follow-up plan:   Return for (Clinic) procedure: (B) C-FCT Blk #1, (Sed-anx).      Interventional Therapies  Risk  Complexity Considerations:   Estimated body mass index is 24.11 kg/m as calculated from the following:   Height as of this encounter: 5' 10" (1.778 m).   Weight as of this encounter: 168 lb (76.2 kg). WNL   Planned  Pending:   Diagnostic/therapeutic right L3 and L4 TFESI #1    Under consideration:   Diagnostic/therapeutic right L3 and L4 TFESI #1    Completed:   Diagnostic bilateral lumbar facet MBB x2 (05/23/2020) (100/100/70/>50)  Diagnostic bilateral SI joint Blk x2 (02/13/2021) (100/100/100 x3 to 4 days/50)  Therapeutic left lumbar facet RFA x1 (11/21/2020) (100/100/100/100)  Therapeutic left SI joint RFA x1 (11/21/2020) (100/100/100/100)  Therapeutic right lumbar facet RFA x1 (12/05/2020) (100/100/100/100)  Therapeutic right SI joint RFA x1 (12/05/2020) (100/100/100/100)    Therapeutic  Palliative (PRN) options:   Palliative bilateral lumbar facet RFA #2  Palliative bilateral SI joint RFA #2     Recent Visits Date Type Provider Dept  06/05/21 Office Visit Albert Pointer, MD [facility]  04/01/21 Office Visit Albert Pointer, MD  [facility]  Showing recent visits within past 90 days and meeting all other requirements Today's Visits Date Type Provider Dept  06/11/21 Office Visit Albert Pointer, MD [facility]  Showing today's visits and meeting all other requirements Future Appointments No visits were found meeting these conditions. Showing future appointments within next 90 days and meeting all other requirements I discussed the assessment and treatment plan with the patient. The patient was provided an opportunity to ask questions and all were answered. The patient agreed with the plan and demonstrated an understanding of the instructions.  Patient advised to call back or seek an in-person evaluation if the symptoms or condition worsens.  Duration of encounter: 25 minutes.  Note by: Gaspar Cola, MD Date: 06/11/2021; Time: 12:18 PM

## 2021-06-23 DIAGNOSIS — M5030 Other cervical disc degeneration, unspecified cervical region: Secondary | ICD-10-CM | POA: Insufficient documentation

## 2021-06-23 NOTE — Progress Notes (Deleted)
No-show to procedure  

## 2021-06-24 ENCOUNTER — Ambulatory Visit: Payer: No Typology Code available for payment source | Admitting: Pain Medicine

## 2021-06-24 DIAGNOSIS — M47812 Spondylosis without myelopathy or radiculopathy, cervical region: Secondary | ICD-10-CM

## 2021-06-24 DIAGNOSIS — M542 Cervicalgia: Secondary | ICD-10-CM

## 2021-06-24 DIAGNOSIS — M5030 Other cervical disc degeneration, unspecified cervical region: Secondary | ICD-10-CM

## 2021-06-26 ENCOUNTER — Encounter: Payer: Self-pay | Admitting: Pain Medicine

## 2021-06-26 ENCOUNTER — Ambulatory Visit
Admission: RE | Admit: 2021-06-26 | Discharge: 2021-06-26 | Disposition: A | Discharge status: Home / Self Care | Payer: No Typology Code available for payment source | Source: Ambulatory Visit | Attending: Pain Medicine | Admitting: Pain Medicine

## 2021-06-26 ENCOUNTER — Other Ambulatory Visit: Payer: Self-pay

## 2021-06-26 ENCOUNTER — Ambulatory Visit (HOSPITAL_BASED_OUTPATIENT_CLINIC_OR_DEPARTMENT_OTHER): Payer: No Typology Code available for payment source | Admitting: Pain Medicine

## 2021-06-26 VITALS — BP 156/77 | HR 85 | Temp 97.2°F | Resp 16 | Ht 70.0 in | Wt 165.0 lb

## 2021-06-26 DIAGNOSIS — R55 Syncope and collapse: Secondary | ICD-10-CM | POA: Insufficient documentation

## 2021-06-26 DIAGNOSIS — M542 Cervicalgia: Secondary | ICD-10-CM | POA: Insufficient documentation

## 2021-06-26 DIAGNOSIS — M5030 Other cervical disc degeneration, unspecified cervical region: Secondary | ICD-10-CM

## 2021-06-26 DIAGNOSIS — M47812 Spondylosis without myelopathy or radiculopathy, cervical region: Secondary | ICD-10-CM | POA: Diagnosis present

## 2021-06-26 MED ORDER — ROPIVACAINE HCL 2 MG/ML IJ SOLN
INTRAMUSCULAR | Status: AC
  Filled 2021-06-26: qty 20

## 2021-06-26 MED ORDER — EPHEDRINE SULFATE 50 MG/ML IJ SOLN: 10.0000 mg | Freq: Once | INTRAMUSCULAR | Status: DC

## 2021-06-26 MED ORDER — LIDOCAINE HCL 2 % IJ SOLN
20.0000 mL | Freq: Once | INTRAMUSCULAR | Status: AC
  Administered 2021-06-26: 200 mg

## 2021-06-26 MED ORDER — PENTAFLUOROPROP-TETRAFLUOROETH EX AERO
INHALATION_SPRAY | Freq: Once | CUTANEOUS | Status: AC
  Administered 2021-06-26: 30 via TOPICAL
  Filled 2021-06-26: qty 116

## 2021-06-26 MED ORDER — LIDOCAINE HCL (PF) 1 % IJ SOLN
INTRAMUSCULAR | Status: AC
  Filled 2021-06-26: qty 5

## 2021-06-26 MED ORDER — EPHEDRINE SULFATE 50 MG/ML IJ SOLN
10.0000 mg | Freq: Once | INTRAMUSCULAR | Status: AC
  Administered 2021-06-26: 10 mg via INTRAVENOUS

## 2021-06-26 MED ORDER — DEXAMETHASONE SODIUM PHOSPHATE 10 MG/ML IJ SOLN
INTRAMUSCULAR | Status: AC
  Filled 2021-06-26: qty 2

## 2021-06-26 MED ORDER — EPHEDRINE SULFATE 50 MG/ML IJ SOLN
INTRAMUSCULAR | Status: AC
  Filled 2021-06-26: qty 1

## 2021-06-26 MED ORDER — ROPIVACAINE HCL 2 MG/ML IJ SOLN
18.0000 mL | Freq: Once | INTRAMUSCULAR | Status: AC
  Administered 2021-06-26: 18 mL via PERINEURAL

## 2021-06-26 MED ORDER — DEXAMETHASONE SODIUM PHOSPHATE 10 MG/ML IJ SOLN
20.0000 mg | Freq: Once | INTRAMUSCULAR | Status: AC
  Administered 2021-06-26: 20 mg

## 2021-06-26 MED ORDER — LACTATED RINGERS IV SOLN: 1000.0000 mL | Freq: Once | INTRAVENOUS | Status: DC

## 2021-06-26 NOTE — Patient Instructions (Addendum)
` ____________________________________________________________________________________________  Post-Procedure Discharge Instructions  Instructions: Apply ice:  Purpose: This will minimize any swelling and discomfort after procedure.  When: Day of procedure, as soon as you get home. How: Fill a plastic sandwich bag with crushed ice. Cover it with a small towel and apply to injection site. How long: (15 min on, 15 min off) Apply for 15 minutes then remove x 15 minutes.  Repeat sequence on day of procedure, until you go to bed. Apply heat:  Purpose: To treat any soreness and discomfort from the procedure. When: Starting the next day after the procedure. How: Apply heat to procedure site starting the day following the procedure. How long: May continue to repeat daily, until discomfort goes away. Food intake: Start with clear liquids (like water) and advance to regular food, as tolerated.  Physical activities: Keep activities to a minimum for the first 8 hours after the procedure. After that, then as tolerated. Driving: If you have received any sedation, be responsible and do not drive. You are not allowed to drive for 24 hours after having sedation. Blood thinner: (Applies only to those taking blood thinners) You may restart your blood thinner 6 hours after your procedure. Insulin: (Applies only to Diabetic patients taking insulin) As soon as you can eat, you may resume your normal dosing schedule. Infection prevention: Keep procedure site clean and dry. Shower daily and clean area with soap and water. Post-procedure Pain Diary: Extremely important that this be done correctly and accurately. Recorded information will be used to determine the next step in treatment. For the purpose of accuracy, follow these rules: Evaluate only the area treated. Do not report or include pain from an untreated area. For the purpose of this evaluation, ignore all other areas of pain, except for the treated  area. After your procedure, avoid taking a long nap and attempting to complete the pain diary after you wake up. Instead, set your alarm clock to go off every hour, on the hour, for the initial 8 hours after the procedure. Document the duration of the numbing medicine, and the relief you are getting from it. Do not go to sleep and attempt to complete it later. It will not be accurate. If you received sedation, it is likely that you were given a medication that may cause amnesia. Because of this, completing the diary at a later time may cause the information to be inaccurate. This information is needed to plan your care. Follow-up appointment: Keep your post-procedure follow-up evaluation appointment after the procedure (usually 2 weeks for most procedures, 6 weeks for radiofrequencies). DO NOT FORGET to bring you pain diary with you.   Expect: (What should I expect to see with my procedure?) From numbing medicine (AKA: Local Anesthetics): Numbness or decrease in pain. You may also experience some weakness, which if present, could last for the duration of the local anesthetic. Onset: Full effect within 15 minutes of injected. Duration: It will depend on the type of local anesthetic used. On the average, 1 to 8 hours.  From steroids (Applies only if steroids were used): Decrease in swelling or inflammation. Once inflammation is improved, relief of the pain will follow. Onset of benefits: Depends on the amount of swelling present. The more swelling, the longer it will take for the benefits to be seen. In some cases, up to 10 days. Duration: Steroids will stay in the system x 2 weeks. Duration of benefits will depend on multiple posibilities including persistent irritating factors. Side-effects: If present,   they may typically last 2 weeks (the duration of the steroids). Frequent: Cramps (if they occur, drink Gatorade and take over-the-counter Magnesium 450-500 mg once to twice a day); water retention with  temporary weight gain; increases in blood sugar; decreased immune system response; increased appetite. Occasional: Facial flushing (red, warm cheeks); mood swings; menstrual changes. Uncommon: Long-term decrease or suppression of natural hormones; bone thinning. (These are more common with higher doses or more frequent use. This is why we prefer that our patients avoid having any injection therapies in other practices.)  Very Rare: Severe mood changes; psychosis; aseptic necrosis. From procedure: Some discomfort is to be expected once the numbing medicine wears off. This should be minimal if ice and heat are applied as instructed.  Call if: (When should I call?) You experience numbness and weakness that gets worse with time, as opposed to wearing off. New onset bowel or bladder incontinence. (Applies only to procedures done in the spine)  Emergency Numbers: Durning business hours (Monday - Thursday, 8:00 AM - 4:00 PM) (Friday, 9:00 AM - 12:00 Noon): [phone] After hours: [phone] NOTE: If you are having a problem and are unable connect with, or to talk to a provider, then go to your nearest urgent care or emergency department. If the problem is serious and urgent, please call 911. ____________________________________________________________________________________________   Vasovagal Reflex  What is a vasovagal Reflex? It is the most common cause of fainting. It is commonly seen by physicians that specialize in spinal procedures. It is usually not harmful nor a sign of a more serious problem.  Many nerves connect with your heart and blood vessels. These nerves help control the speed and force of your heartbeat. They also regulate blood pressure. Usually, these nerves coordinate their actions so you always get enough blood to your brain. Under certain situations, these nerves can send an inappropriate signal. This can cause your heart rate and blood pressure to go down. This in turn  can lead to decrease in blood flow to the brain causing you to lose consciousness. This brief loss of consciousness is known as a "syncope" or "fainting".  Vasovagal syncope is quite common. It most often affects children and young adults, but it can happen at any age. It happens to men and women in about equal numbers. Unlike some other causes of fainting, vasovagal syncope does not signal an underlying problem with the heart or brain.  What causes vasovagal syncope? Several triggers can cause vasovagal syncope: standing for long periods; excess heat; intense emotion, such as fear; intense pain; the sight of blood or a needle; prolonged exertion; dehydration; or skipping meals.  Older adults may have other specific triggers, for example: urinating; swallowing; coughing; or having a bowel movement.  Triggers associated to medical interventions: procedures close to the spine (i.e.: spinal or epidural injections; nerve blocks close to the spine; spinal manipulation; etc.)  What are the symptoms? Fainting is the defining symptom of vasovagal syncope. Often, you may have certain symptoms before actually fainting such as: nausea; warmth; turning pale; getting sweaty palms; feeling dizzy or lightheaded; blurred vision; or a feeling of impending doom.  How is a vasovagal reflex diagnosed? Always let your physician and staff know if you have a history of "fainting" or "passing out". During procedures, your vital signs (blood pressure, heart rate, and oxygen saturation) will be monitored. Your physician will be watching for the warning signs of vasovagal syncope, like dizziness, nausea, or sweaty palms. If you begin to feel   dizzy or lightheaded, immediately notify your healthcare provider. During medical procedures it is easily diagnosed using the EKG and blood pressure monitor. These will show a decrease in heart rate followed by a similar decrease in blood pressure. Clinically, the patient will indicate  feeling dizzy and lightheaded.  How is it treated? The bed will be tilted so as to have your head lower than your body. This will help send more blood flow to the brain. In addition, the procedure may be cut short to temporarily stop stimulation. You may also be given medications to speed up the heart rate and increase the blood pressure. Hospital protocols also call for patients to be observed for 15 to 30 minutes.  Do I need to do anything? This reflex does not affect everyone. However, those who have a history of "passing out" or "fainting", tend to experience these episodes more frequently. If you have a history of "passing out" or "fainting", let your physician know, since your doctor may have some suggestions on how to help prevent it.  

## 2021-06-26 NOTE — Progress Notes (Signed)
PROVIDER NOTE: Information contained herein reflects review and annotations entered in association with encounter. Interpretation of such information and data should be left to medically-trained personnel. Information provided to patient can be located elsewhere in the medical record under "Patient Instructions". Document created using STT-dictation technology, any transcriptional errors that may result from process are unintentional.    Patient: Albert Gordon  Service Category: Procedure Provider: Gaspar Cola, MD DOB: [birth year] DOS: 06/26/2021 Location: [facility] MRN: [id number] Setting: Ambulatory - outpatient Referring Provider: Ferdie Ping, MD Type: Established Patient Specialty: Interventional Pain Management PCP: Ferdie Ping, MD  Primary Reason for Visit: Interventional Pain Management Treatment. CC: Neck Pain   Procedure:          Anesthesia, Analgesia, Anxiolysis:  Type: Cervical Facet Medial Branch Block(s)  #1  Primary Purpose: Diagnostic Region: Posterolateral cervical spine Level: C3, C4, C5, C6, & C7 Medial Branch Level(s). Injecting these levels blocks the C3-4, C4-5, C5-6, and C6-7 cervical facet joints. Laterality: Bilateral  Anesthesia: Local (1-2% Lidocaine)  Anxiolysis: None  Sedation: None  Guidance: Fluoroscopy           Position: Prone with head of the table raised to facilitate breathing.   Indications: 1. 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 Spondylosis without myelopathy or radiculopathy, cervical region   3. DDD (degenerative disc disease), cervical   4. 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 Cervicalgia    Pain Score: Pre-procedure: 5 /10 Post-procedure: 0-No pain/10     Pre-op H&P Assessment:  Albert Gordon is a 75 y.o. (year old), male patient, seen today for interventional treatment. He  has a past surgical history that includes Hernia repair. Albert Gordon has a current medication list  which includes the following prescription(s): atorvastatin, finasteride, hydrocodone-acetaminophen, morphine, ropinirole, salicyclic acid-sulfur, tamsulosin, triamterene-hydrochlorothiazide, and melatonin, and the following Facility-Administered Medications: lactated ringers. His primarily concern today is the Neck Pain  Initial Vital Signs:  Pulse/HCG Rate: 76ECG Heart Rate: (!) 57 Temp: (!) 97.1 F (36.2 C) Resp: 14 BP: 135/80 SpO2: 97 %  BMI: Estimated body mass index is 23.68 kg/m as calculated from the following:   Height as of this encounter: 5\' 10"  (1.778 m).   Weight as of this encounter: 165 lb (74.8 kg).  Risk Assessment: Allergies: Reviewed. He has No Known Allergies.  Allergy Precautions: None required Coagulopathies: Reviewed. None identified.  Blood-thinner therapy: None at this time Active Infection(s): Reviewed. None identified. Albert Gordon is afebrile  Site Confirmation: Albert Gordon was asked to confirm the procedure and laterality before marking the site Procedure checklist: Completed Consent: Before the procedure and under the influence of no sedative(s), amnesic(s), or anxiolytics, the patient was informed of the treatment options, risks and possible complications. To fulfill our ethical and legal obligations, as recommended by the American Medical Association's Code of Ethics, I have informed the patient of my clinical impression; the nature and purpose of the treatment or procedure; the risks, benefits, and possible complications of the intervention; the alternatives, including doing nothing; the risk(s) and benefit(s) of the alternative treatment(s) or procedure(s); and the risk(s) and benefit(s) of doing nothing. The patient was provided information about the general risks and possible complications associated with the procedure. These may include, but are not limited to: failure to achieve desired goals, infection, bleeding, organ or nerve damage, allergic reactions,  paralysis, and death. In addition, the patient was informed of those risks and complications associated to [facility] procedures, such as  failure to decrease pain; infection (i.e.: Meningitis, epidural or intraspinal abscess); bleeding (i.e.: epidural hematoma, subarachnoid hemorrhage, or any other type of intraspinal or peri-dural bleeding); organ or nerve damage (i.e.: Any type of peripheral nerve, nerve root, or spinal cord injury) with subsequent damage to sensory, motor, and/or autonomic systems, resulting in permanent pain, numbness, and/or weakness of one or several areas of the body; allergic reactions; (i.e.: anaphylactic reaction); and/or death. Furthermore, the patient was informed of those risks and complications associated with the medications. These include, but are not limited to: allergic reactions (i.e.: anaphylactic or anaphylactoid reaction(s)); adrenal axis suppression; blood sugar elevation that in diabetics may result in ketoacidosis or comma; water retention that in patients with history of congestive heart failure may result in shortness of breath, pulmonary edema, and decompensation with resultant heart failure; weight gain; swelling or edema; medication-induced neural toxicity; particulate matter embolism and blood vessel occlusion with resultant organ, and/or nervous system infarction; and/or aseptic necrosis of one or more joints. Finally, the patient was informed that Medicine is not an exact science; therefore, there is also the possibility of unforeseen or unpredictable risks and/or possible complications that may result in a catastrophic outcome. The patient indicated having understood very clearly. We have given the patient no guarantees and we have made no promises. Enough time was given to the patient to ask questions, all of which were answered to the patient's satisfaction. Albert Gordon has indicated that he wanted to continue with the procedure. Attestation: I, the ordering  provider, attest that I have discussed with the patient the benefits, risks, side-effects, alternatives, likelihood of achieving goals, and potential problems during recovery for the procedure that I have provided informed consent. Date  Time: 06/26/2021  2:00 PM  Pre-Procedure Preparation:  Monitoring: As per clinic protocol. Respiration, ETCO2, SpO2, BP, heart rate and rhythm monitor placed and checked for adequate function Safety Precautions: Patient was assessed for positional comfort and pressure points before starting the procedure. Time-out: I initiated and conducted the "Time-out" before starting the procedure, as per protocol. The patient was asked to participate by confirming the accuracy of the "Time Out" information. Verification of the correct person, site, and procedure were performed and confirmed by me, the nursing staff, and the patient. "Time-out" conducted as per Joint Commission's Universal Protocol (UP.01.01.01). Time: 1417  Description of Procedure:          Laterality: Bilateral. The procedure was performed in identical fashion on both sides. Level: C3, C4, C5, C6, & C7 Medial Branch Level(s). Area Prepped: Posterior Cervico-thoracic Region DuraPrep (Iodine Povacrylex [0.7% available iodine] and Isopropyl Alcohol, 74% w/w) Safety Precautions: Aspiration looking for blood return was conducted prior to all injections. At no point did we inject any substances, as a needle was being advanced. Before injecting, the patient was told to immediately notify me if he was experiencing any new onset of "ringing in the ears, or metallic taste in the mouth". No attempts were made at seeking any paresthesias. Safe injection practices and needle disposal techniques used. Medications properly checked for expiration dates. SDV (single dose vial) medications used. After the completion of the procedure, all disposable equipment used was discarded in the proper designated medical waste  containers. Local Anesthesia: Protocol guidelines were followed. The patient was positioned over the fluoroscopy table. The area was prepped in the usual manner. The time-out was completed. The target area was identified using fluoroscopy. A 12-in long, straight, sterile hemostat was used with fluoroscopic guidance to locate the targets  for each level blocked. Once located, the skin was marked with an approved surgical skin marker. Once all sites were marked, the skin (epidermis, dermis, and hypodermis), as well as deeper tissues (fat, connective tissue and muscle) were infiltrated with a small amount of a short-acting local anesthetic, loaded on a 10cc syringe with a 25G, 1.5-in  Needle. An appropriate amount of time was allowed for local anesthetics to take effect before proceeding to the next step. Local Anesthetic: Lidocaine 2.0% The unused portion of the local anesthetic was discarded in the proper designated containers. Technical explanation of process:  C3 Medial Branch Nerve Block (MBB): The target area for the C3 dorsal medial articular branch is the lateral concave waist of the articular pillar of C3. Under fluoroscopic guidance, a Quincke needle was inserted until contact was made with os over the postero-lateral aspect of the articular pillar of C3 (target area). After negative aspiration for blood, 0.5 mL of the nerve block solution was injected without difficulty or complication. The needle was removed intact. C4 Medial Branch Nerve Block (MBB): The target area for the C4 dorsal medial articular branch is the lateral concave waist of the articular pillar of C4. Under fluoroscopic guidance, a Quincke needle was inserted until contact was made with os over the postero-lateral aspect of the articular pillar of C4 (target area). After negative aspiration for blood, 0.5 mL of the nerve block solution was injected without difficulty or complication. The needle was removed intact. C5 Medial Branch Nerve  Block (MBB): The target area for the C5 dorsal medial articular branch is the lateral concave waist of the articular pillar of C5. Under fluoroscopic guidance, a Quincke needle was inserted until contact was made with os over the postero-lateral aspect of the articular pillar of C5 (target area). After negative aspiration for blood, 0.5 mL of the nerve block solution was injected without difficulty or complication. The needle was removed intact. C6 Medial Branch Nerve Block (MBB): The target area for the C6 dorsal medial articular branch is the lateral concave waist of the articular pillar of C6. Under fluoroscopic guidance, a Quincke needle was inserted until contact was made with os over the postero-lateral aspect of the articular pillar of C6 (target area). After negative aspiration for blood, 0.5 mL of the nerve block solution was injected without difficulty or complication. The needle was removed intact. C7 Medial Branch Nerve Block (MBB): The target for the C7 dorsal medial articular branch lies on the superior-medial tip of the C7 transverse process. Under fluoroscopic guidance, a Quincke needle was inserted until contact was made with os over the postero-lateral aspect of the articular pillar of C7 (target area). After negative aspiration for blood, 0.5 mL of the nerve block solution was injected without difficulty or complication. The needle was removed intact. Procedural Needles: 22-gauge, 3.5-inch, Quincke needles used for all levels. Nerve block solution: 0.2% PF-Ropivacaine + Triamcinolone (40 mg/mL) diluted to a final concentration of 4 mg of Triamcinolone/mL of Ropivacaine The unused portion of the solution was discarded in the proper designated containers.  Once the entire procedure was completed, the treated area was cleaned, making sure to leave some of the prepping solution back to take advantage of its long term bactericidal properties.  Anatomy Reference Guide:       Vitals:    06/26/21 1435 06/26/21 1440 06/26/21 1447 06/26/21 1457  BP: 123/76 137/85 (!) 146/75 (!) 156/77  Pulse: 63 85    Resp:  16 16 16   Temp:   Marland Kitchen)  97.2 F (36.2 C)   TempSrc:   Temporal   SpO2: 99% 100% 100% 99%  Weight:      Height:        Start Time: 1417 hrs. End Time: 1439 hrs.  Imaging Guidance (Spinal):          Type of Imaging Technique: Fluoroscopy Guidance (Spinal) Indication(s): Assistance in needle guidance and placement for procedures requiring needle placement in or near specific anatomical locations not easily accessible without such assistance. Exposure Time: Please see nurses notes. Contrast: None used. Fluoroscopic Guidance: I was personally present during the use of fluoroscopy. "Tunnel Vision Technique" used to obtain the best possible view of the target area. Parallax error corrected before commencing the procedure. "Direction-depth-direction" technique used to introduce the needle under continuous pulsed fluoroscopy. Once target was reached, antero-posterior, oblique, and lateral fluoroscopic projection used confirm needle placement in all planes. Images permanently stored in EMR. Interpretation: No contrast injected. I personally interpreted the imaging intraoperatively. Adequate needle placement confirmed in multiple planes. Permanent images saved into the patient's record.  Antibiotic Prophylaxis:   Anti-infectives (From admission, onward)    None      Indication(s): None identified  Post-operative Assessment:  Post-procedure Vital Signs:  Pulse/HCG Rate: 85 (nsr)(!) 58 Temp: (!) 97.2 F (36.2 C) Resp: 16 BP: (!) 156/77 SpO2: 99 %  EBL: None  Complications:  The patient did experience a vasovagal episode with the stimulation of the cervical spine.  The patient was switched to the Trendelenburg position and an IV was started and he was provided with IV ephedrine.  This immediately resolved the episode of hypotension as well as his nausea.  Note: The  patient tolerated the entire procedure well. A repeat set of vitals were taken after the procedure and the patient was kept under observation following institutional policy, for this type of procedure. Post-procedural neurological assessment was performed, showing return to baseline, prior to discharge. The patient was provided with post-procedure discharge instructions, including a section on how to identify potential problems. Should any problems arise concerning this procedure, the patient was given instructions to immediately contact us, at any time, without hesitation. In any case, we plan to contact the patient by telephone for a follow-up status report regarding this interventional procedure.  Comments:  No additional relevant information.  Plan of Care  Orders:  Orders Placed This Encounter  Procedures   CERVICAL FACET (MEDIAL BRANCH NERVE BLOCK)     Scheduling Instructions:     Side: Bilateral     Level: C3-4, C4-5, C5-6 Facet joints (C3, C4, C5, C6, & C7 Medial Branch Nerves)     Sedation: Patient's choice.     Timeframe: Today    Order Specific Question:   Where will this procedure be performed?    Answer:   [facility]   DG [facility] C-ARM 1-60 MIN NO REPORT    Intraoperative interpretation by procedural physician at [facility].    Standing Status:   Standing    Number of Occurrences:   1    Order Specific Question:   Reason for exam:    Answer:   Assistance in needle guidance and placement for procedures requiring needle placement in or near specific anatomical locations not easily accessible without such assistance.   Informed Consent Details: Physician/Practitioner Attestation; Transcribe to consent form and obtain patient signature    Note: Always confirm laterality of pain with Mr. Stcyr, before procedure. Transcribe to consent form and obtain patient signature.  Order Specific Question:   Physician/Practitioner attestation of informed consent for  procedure/surgical case    Answer:   I, the physician/practitioner, attest that I have discussed with the patient the benefits, risks, side effects, alternatives, likelihood of achieving goals and potential problems during recovery for the procedure that I have provided informed consent.    Order Specific Question:   Procedure    Answer:   Bilateral Cervical facet block under fluoroscopic guidance.    Order Specific Question:   Physician/Practitioner performing the procedure    Answer:   Rufino Staup A. 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 Morphine ER 15 mg daily (15 MME) + hydrocodone/APAP 10/325 1 tablet every 8 hours (45 MME/day) written by the "[employer]" MME/day: 45 mg/day   Medications ordered for procedure: Meds ordered this encounter  Medications   lidocaine (XYLOCAINE) 2 % (with pres) injection 400 mg   ropivacaine (PF) 2 mg/mL (0.2%) (NAROPIN) injection 18 mL   dexamethasone (DECADRON) injection 20 mg   pentafluoroprop-tetrafluoroeth (GEBAUERS) aerosol   lactated ringers infusion 1,000 mL   DISCONTD: ePHEDrine injection 10-25 mg   ePHEDrine injection 10 mg    Medications administered: We administered lidocaine, ropivacaine (PF) 2 mg/mL (0.2%), dexamethasone, pentafluoroprop-tetrafluoroeth, and ePHEDrine.  See the medical record for exact dosing, route, and time of administration.  Follow-up plan:   Return in about 2 weeks (around 07/10/2021) for Proc-day (T,Th), (VV), (PPE).     Interventional Therapies   Risk  Complexity Considerations:   Estimated body mass index is 24.11 kg/m as calculated from the following:   Height as of this encounter: 5\' 10"  (1.778 m).   Weight as of this encounter: 168 lb (76.2 kg). WNL   Planned  Pending:   Diagnostic bilateral cervical facet MBB #1    Under consideration:   Diagnostic/therapeutic bilateral cervical facet MBB #1  Diagnostic/therapeutic right L3 and L4 TFESI #1    Completed:   Diagnostic bilateral lumbar facet MBB x2 (05/23/2020) (100/100/70/>50)  Diagnostic bilateral SI joint Blk x2 (02/13/2021) (100/100/100 x3 to 4 days/50)  Therapeutic left lumbar facet RFA x1 (11/21/2020) (100/100/100/100)  Therapeutic left SI joint RFA x1 (11/21/2020) (100/100/100/100)  Therapeutic right lumbar facet RFA x1 (12/05/2020) (100/100/100/100)  Therapeutic right SI joint RFA x1 (12/05/2020) (100/100/100/100)    Therapeutic  Palliative (PRN) options:   Palliative bilateral lumbar facet RFA #2  Palliative bilateral SI joint RFA #2      Recent Visits Date Type Provider Dept  06/26/21 Procedure visit Milinda Pointer, MD [facility]  06/11/21 Office Visit Milinda Pointer, MD [facility]  06/05/21 Office Visit Milinda Pointer, MD [facility]  Showing recent visits within past 90 days and meeting all other requirements Future Appointments Date Type Provider Dept  07/10/21 Appointment Milinda Pointer, MD [facility]  Showing future appointments within next 90 days and meeting all other requirements Disposition: Discharge home  Discharge (Date  Time): 06/26/2021; 1509 hrs.  Primary Care Physician: Ferdie Ping, MD Location: [facility] Note by: Gaspar Cola, MD Date: 06/26/2021; Time: 2:26 PM  Disclaimer:  Medicine is not an Chief Strategy Officer. The only guarantee in medicine is that nothing is guaranteed. It is important to note that the decision to proceed with this  intervention was based on the information collected from the patient. The Data and conclusions were drawn from the patient's questionnaire, the interview, and the physical examination. Because the information was provided in large part by the patient, it cannot be guaranteed that it has not been purposely or unconsciously manipulated. Every effort has been made to obtain as much relevant data as possible for this evaluation. It is important to note that the conclusions that lead to this procedure are derived in large part from the available data. Always take into account that the treatment will also be dependent on availability of resources and existing treatment guidelines, considered by other Pain Management Practitioners as being common knowledge and practice, at the time of the intervention. For Medico-Legal purposes, it is also important to point out that variation in procedural techniques and pharmacological choices are the acceptable norm. The indications, contraindications, technique, and results of the above procedure should only be interpreted and judged by a Board-Certified Interventional Pain Specialist with extensive familiarity and expertise in the same exact procedure and technique.

## 2021-06-27 ENCOUNTER — Telehealth: Payer: Self-pay

## 2021-06-27 NOTE — Telephone Encounter (Signed)
Post procedure phone call. Patient states he is doing well.  

## 2021-07-08 NOTE — Progress Notes (Deleted)
Patient: Albert Gordon  Service Category: E/M  Provider: Gaspar Cola, MD  DOB: 1945-08-16  DOS: 07/10/2021  Location: Office  MRN: [id number]  Setting: Ambulatory outpatient  Referring Provider: Ferdie Ping, MD  Type: Established Patient  Specialty: Interventional Pain Management  PCP: Ferdie Ping, MD  Location: Remote location  Delivery: TeleHealth     Virtual Encounter - Pain Management PROVIDER NOTE: Information contained herein reflects review and annotations entered in association with encounter. Interpretation of such information and data should be left to medically-trained personnel. Information provided to patient can be located elsewhere in the medical record under "Patient Instructions". Document created using STT-dictation technology, any transcriptional errors that may result from process are unintentional.    Contact & Pharmacy Preferred: [phone] Home: [phone] (home) Mobile: [phone] (mobile) E-mail: No e-mail address on record  [id number] [place] [id number]: [phone]: [phone]  Pre-screening  Mr. DNearoffered "in-person" vs "virtual" encounter. He indicated preferring virtual for this encounter.   Reason COVID-19*   Social distancing based on CDC and AMA recommendations.   I contacted NTonia Broomson 07/10/2021 via telephone.      I clearly identified myself as FGaspar Cola MD. I verified that I was speaking with the correct person using two identifiers (Name: NMACAULAY REICHER and date of birth: [birth year].  Consent I sought verbal advanced consent from NTonia Broomsfor virtual visit interactions. I informed Mr. DCucciaof possible security and privacy concerns, risks, and limitations associated with providing "not-in-person" medical evaluation and management services. I also informed Mr. DBurnleyof the availability of "in-person" appointments. Finally, I  informed him that there would be a charge for the virtual visit and that he could be  personally, fully or partially, financially responsible for it. Mr. DKarasikexpressed understanding and agreed to proceed.   Historic Elements   Mr. NYACQUB BASTONis a 75y.o. year old, male patient evaluated today after our last contact on 06/26/2021. Mr. DConger has a past medical history of Hypertension. He also  has a past surgical history that includes Hernia repair. Mr. DViverettehas a current medication list which includes the following prescription(s): atorvastatin, finasteride, hydrocodone-acetaminophen, melatonin, morphine, ropinirole, salicyclic acid-sulfur, tamsulosin, and triamterene-hydrochlorothiazide. He  reports that he has quit smoking. He has quit using smokeless tobacco. He reports current alcohol use. He reports that he does not currently use drugs. Mr. DIshamhas No Known Allergies.   HPI  Today, he is being contacted for a post-procedure assessment.  Post-procedure evaluation     Procedure:          Anesthesia, Analgesia, Anxiolysis:  Type: Cervical Facet Medial Branch Block(s)  #1  Primary Purpose: Diagnostic Region: Posterolateral cervical spine Level: C3, C4, C5, C6, & C7 Medial Branch Level(s). Injecting these levels blocks the C3-4, C4-5, C5-6, and C6-7 cervical facet joints. Laterality: Bilateral  Anesthesia: Local (1-2% Lidocaine)  Anxiolysis: None  Sedation: None  Guidance: Fluoroscopy           Position: Prone with head of the table raised to facilitate breathing.   Indications: 1. 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 Spondylosis without myelopathy or radiculopathy, cervical region   3. DDD (degenerative disc disease), cervical   4. 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 Cervicalgia    Pain Score: Pre-procedure: 5 /10 Post-procedure: 0-No pain/10  Effectiveness:  Initial hour after procedure:   ***. Subsequent 4-6 hours  post-procedure:   ***. Analgesia past initial 6 hours:   ***. Ongoing improvement:  Analgesic:  *** Function:  ***  ROM:  ***   Pharmacotherapy Assessment   Opioid Analgesic: No opioid analgesics prescribed by our practice. Morphine ER 15 mg daily (15 MME) + hydrocodone/APAP 10/325 1 tablet every 8 hours (45 MME/day) written by the "[facility]" MME/day: 45 mg/day   Monitoring: [place] PMP: PDMP reviewed during this encounter.       Pharmacotherapy: No side-effects or adverse reactions reported. Compliance: No problems identified. Effectiveness: Clinically acceptable. Plan: Refer to "POC". UDS:  Summary  Date Value Ref Range Status  01/29/2020 Note  Final    Comment:    ==================================================================== Compliance Drug Analysis, Ur ==================================================================== Test                             Result       Flag       Units  Drug Present and Declared for Prescription Verification   Morphine                       3126         EXPECTED   ng/mg creat   Normorphine                    100          EXPECTED   ng/mg creat    Potential sources of morphine include administration of codeine or    morphine, use of heroin, or ingestion of poppy seeds.     Normorphine is an expected metabolite of morphine.    Hydrocodone                    187          EXPECTED   ng/mg creat   Dihydrocodeine                 169          EXPECTED   ng/mg creat   Norhydrocodone                 3061         EXPECTED   ng/mg creat    Sources of hydrocodone include scheduled prescription medications.    Dihydrocodeine and norhydrocodone are expected metabolites of    hydrocodone. Dihydrocodeine is also available as a scheduled    prescription medication.    Acetaminophen                  PRESENT      EXPECTED ==================================================================== Test                      Result    Flag   Units       Ref Range   Creatinine              61               mg/dL      >=20 ==================================================================== Declared Medications:  The flagging and interpretation on this report are based on the  following declared medications.  Unexpected results may arise from  inaccuracies in the declared medications.   **Note: The testing scope of this panel includes these medications:   Hydrocodone (Norco)  Morphine (MSIR)   **  Note: The testing scope of this panel does not include small to  moderate amounts of these reported medications:   Acetaminophen (Norco)   **Note: The testing scope of this panel does not include the  following reported medications:   Atorvastatin (Lipitor)  Hydrochlorothiazide (Maxzide)  Tamsulosin (Flomax)  Terazosin (Hytrin)  Triamterene (Maxzide) ==================================================================== For clinical consultation, please call [phone]. ====================================================================      Laboratory Chemistry Profile   Renal Lab Results  Component Value Date   BUN 15 01/29/2020   CREATININE 0.93 01/29/2020   BCR 16 01/29/2020   GFRAA 93 01/29/2020   GFRNONAA 80 01/29/2020    Hepatic Lab Results  Component Value Date   AST 18 01/29/2020   ALBUMIN 4.4 01/29/2020   ALKPHOS 50 01/29/2020    Electrolytes Lab Results  Component Value Date   NA 142 01/29/2020   K 4.8 01/29/2020   CL 102 01/29/2020   CALCIUM 9.4 01/29/2020   MG 2.3 01/29/2020    Bone Lab Results  Component Value Date   25OHVITD1 54 01/29/2020   25OHVITD2 <1.0 01/29/2020   25OHVITD3 54 01/29/2020    Inflammation (CRP: Acute Phase) (ESR: Chronic Phase) Lab Results  Component Value Date   CRP <1 01/29/2020   ESRSEDRATE 2 01/29/2020         Note: Above Lab results reviewed.  Imaging  DG PAIN CLINIC C-ARM 1-60 MIN NO REPORT Fluoro was used, but no Radiologist interpretation will be  provided.  Please refer to "NOTES" tab for provider progress note.  Assessment  There were no encounter diagnoses.  Plan of Care  Problem-specific:  No problem-specific Assessment & Plan notes found for this encounter.  Mr. MEREL SANTOLI has a current medication list which includes the following long-term medication(s): melatonin, ropinirole, and triamterene-hydrochlorothiazide.  Pharmacotherapy (Medications Ordered): No orders of the defined types were placed in this encounter.  Orders:  No orders of the defined types were placed in this encounter.  Follow-up plan:   No follow-ups on file.     Interventional Therapies  Risk   Complexity Considerations:   Estimated body mass index is 24.11 kg/m as calculated from the following:   Height as of this encounter: '5\' 10"'  (1.778 m).   Weight as of this encounter: 168 lb (76.2 kg). WNL   Planned   Pending:   Diagnostic bilateral cervical facet MBB #1    Under consideration:   Diagnostic/therapeutic bilateral cervical facet MBB #1  Diagnostic/therapeutic right L3 and L4 TFESI #1    Completed:   Diagnostic bilateral lumbar facet MBB x2 (05/23/2020) (100/100/70/>50)  Diagnostic bilateral SI joint Blk x2 (02/13/2021) (100/100/100 x3 to 4 days/50)  Therapeutic left lumbar facet RFA x1 (11/21/2020) (100/100/100/100)  Therapeutic left SI joint RFA x1 (11/21/2020) (100/100/100/100)  Therapeutic right lumbar facet RFA x1 (12/05/2020) (100/100/100/100)  Therapeutic right SI joint RFA x1 (12/05/2020) (100/100/100/100)    Therapeutic   Palliative (PRN) options:   Palliative bilateral lumbar facet RFA #2  Palliative bilateral SI joint RFA #2      Recent Visits Date Type Provider Dept  06/26/21 Procedure visit Milinda Pointer, MD [facility]  06/11/21 Office Visit Milinda Pointer, MD [facility]  06/05/21 Office Visit Milinda Pointer, MD [facility]  Showing recent visits within past 90 days and meeting  all other requirements Future Appointments Date Type Provider Dept  07/10/21 Appointment Milinda Pointer, MD [facility]  Showing future appointments within next 90 days and meeting all other requirements  I discussed the assessment and treatment plan with the patient. The patient was provided an opportunity to ask questions and all were answered. The patient agreed with the plan and demonstrated an understanding of the instructions.  Patient advised to call back or seek an in-person evaluation if the symptoms or condition worsens.  Duration of encounter: *** minutes.  Note by: Gaspar Cola, MD Date: 07/10/2021; Time: 12:43 PM

## 2021-07-10 ENCOUNTER — Telehealth: Payer: No Typology Code available for payment source | Admitting: Pain Medicine

## 2021-07-21 NOTE — Progress Notes (Signed)
Patient: Albert Gordon  Service Category: E/M  Provider: Gaspar Cola, MD  DOB: 19-Nov-1945  DOS: 07/23/2021  Location: Office  MRN: [id number]  Setting: Ambulatory outpatient  Referring Provider: Ferdie Ping, MD  Type: Established Patient  Specialty: Interventional Pain Management  PCP: Ferdie Ping, MD  Location: Remote location  Delivery: TeleHealth     Virtual Encounter - Pain Management PROVIDER NOTE: Information contained herein reflects review and annotations entered in association with encounter. Interpretation of such information and data should be left to medically-trained personnel. Information provided to patient can be located elsewhere in the medical record under "Patient Instructions". Document created using STT-dictation technology, any transcriptional errors that may result from process are unintentional.    Contact & Pharmacy Preferred: [phone] Home: [phone] (home) Mobile: [phone] (mobile) E-mail: No e-mail address on record  [id number] [place] [id number]: [phone]: [phone]  Pre-screening  Mr. DLandstromoffered "in-person" vs "virtual" encounter. He indicated preferring virtual for this encounter.   Reason COVID-19*   Social distancing based on CDC and AMA recommendations.   I contacted NTonia Broomson 07/23/2021 via telephone.      I clearly identified myself as FGaspar Cola MD. I verified that I was speaking with the correct person using two identifiers (Name: NMORRISON MCBRYAR and date of birth: [birth year].  Consent I sought verbal advanced consent from NTonia Broomsfor virtual visit interactions. I informed Mr. DCarlineof possible security and privacy concerns, risks, and limitations associated with providing "not-in-person" medical evaluation and management services. I also informed Mr. DMarkinof the availability of "in-person" appointments. Finally, I informed  him that there would be a charge for the virtual visit and that he could be  personally, fully or partially, financially responsible for it. Mr. DDipaolaexpressed understanding and agreed to proceed.   Historic Elements   Mr. NALBIN DUCKETTis a 76y.o. year old, male patient evaluated today after our last contact on 06/26/2021. Albert Gordon has a past medical history of Hypertension. He also  has a past surgical history that includes Hernia repair. Albert Gordon a current medication list which includes the following prescription(s): atorvastatin, finasteride, hydrocodone-acetaminophen, morphine, ropinirole, salicyclic acid-sulfur, tamsulosin, triamterene-hydrochlorothiazide, and melatonin. He  reports that he has quit smoking. He has quit using smokeless tobacco. He reports current alcohol use. He reports that he does not currently use drugs. Albert Gordon No Known Allergies.   HPI  Today, he is being contacted for a post-procedure assessment.  The patient refers having attained 100% relief of the pain for the duration of the local anesthetic.  This included the headaches, neck pain, and Hoper back pain in between the shoulder blades.  Once the local anesthetic wore off, then the relief of the pain went down to a 50% that has persisted and is ongoing.  In addition the patient indicates having attained a 100% relief of the cervicogenic headaches that have not returned.  He continues to have 50% improvement in the neck and the area between the shoulder blades.  Today we talked about my interpretation of these results and the fact that I believe him to again be having a cervical facet syndrome.  The fact that the local anesthetic completely eliminated the pain would suggest that the pain was coming from the area that was injected.  However, because only 50% of that improvement persisted  past the duration of the local anesthetic, we have to attribute this to the steroids meaning that only 50% of his pain was coming  from an inflammatory process.  It is likely that the rest is coming from the mechanical issues.  Today I went over his alternatives and I reminded him that they are similar to those of the lumbar spine where we also diagnosed him if having problems with the facet joints and we followed up with radiofrequency ablation.  With regards to that, he refers that he only has a little bit of pain in the center of the back, but he attributes this to the fact that he did physical work that was over and beyond what he normally does and he realizes that he might have overdone it.  Today we have decided to wait and see how long would benefit he has with the cervical facet blocks.  I instructed him to give me a call as soon as it wears off and he believes that he needs to have it repeated.  At that time, we will do the second diagnostic injection and if again he gets similar results, then he may be a good candidate for radiofrequency ablation.  Regarding his lower back, he wants to wait and see if they will go away on its own, but but I have encouraged him to also give [place] a call if it does not so that we can evaluate it and see if there is anything else that we need to do for him.  He understood and accepted.  Post-procedure evaluation     Procedure:          Anesthesia, Analgesia, Anxiolysis:  Type: Cervical Facet Medial Branch Block(s)  #1  Primary Purpose: Diagnostic Region: Posterolateral cervical spine Level: C3, C4, C5, C6, & C7 Medial Branch Level(s). Injecting these levels blocks the C3-4, C4-5, C5-6, and C6-7 cervical facet joints. Laterality: Bilateral  Anesthesia: Local (1-2% Lidocaine)  Anxiolysis: None  Sedation: None  Guidance: Fluoroscopy           Position: Prone with head of the table raised to facilitate breathing.   Indications: 1. 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 Spondylosis without myelopathy or radiculopathy, cervical region   3. DDD (degenerative disc disease), cervical    4. 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 Cervicalgia    Pain Score: Pre-procedure: 5 /10 Post-procedure: 0-No pain/10     Effectiveness:  Initial hour after procedure: 100 %. Subsequent 4-6 hours post-procedure: 100 %. Analgesia past initial 6 hours: 50 %. Ongoing improvement:  Analgesic: The patient indicates having an ongoing 50% relief of his neck pain with an ongoing 100% relief of the cervicogenic headaches and an ongoing 50% relief of the pain between the shoulder blades. Function: Albert Gordon reports improvement in function ROM: Mr. Martine reports improvement in ROM  Pharmacotherapy Assessment   Opioid Analgesic: No opioid analgesics prescribed by our practice. Morphine ER 15 mg daily (15 MME) + hydrocodone/APAP 10/325 1 tablet every 8 hours (45 MME/day) written by the "[facility] [employer] [facility]" MME/day: 45 mg/day   Monitoring: [place] PMP: PDMP reviewed during this encounter.       Pharmacotherapy: No side-effects or adverse reactions reported. Compliance: No problems identified. Effectiveness: Clinically acceptable. Plan: Refer to "POC". UDS:  Summary  Date Value Ref Range Status  01/29/2020 Note  Final    Comment:    ==================================================================== Compliance Drug Analysis, Ur ==================================================================== Test  Result       Flag       Units  Drug Present and Declared for Prescription Verification   Morphine                       3126         EXPECTED   ng/mg creat   Normorphine                    100          EXPECTED   ng/mg creat    Potential sources of morphine include administration of codeine or    morphine, use of heroin, or ingestion of poppy seeds.     Normorphine is an expected metabolite of morphine.    Hydrocodone                    187          EXPECTED   ng/mg creat   Dihydrocodeine                 169          EXPECTED   ng/mg  creat   Norhydrocodone                 3061         EXPECTED   ng/mg creat    Sources of hydrocodone include scheduled prescription medications.    Dihydrocodeine and norhydrocodone are expected metabolites of    hydrocodone. Dihydrocodeine is also available as a scheduled    prescription medication.    Acetaminophen                  PRESENT      EXPECTED ==================================================================== Test                      Result    Flag   Units      Ref Range   Creatinine              61               mg/dL      >=20 ==================================================================== Declared Medications:  The flagging and interpretation on this report are based on the  following declared medications.  Unexpected results may arise from  inaccuracies in the declared medications.   **Note: The testing scope of this panel includes these medications:   Hydrocodone (Norco)  Morphine (MSIR)   **Note: The testing scope of this panel does not include small to  moderate amounts of these reported medications:   Acetaminophen (Norco)   **Note: The testing scope of this panel does not include the  following reported medications:   Atorvastatin (Lipitor)  Hydrochlorothiazide (Maxzide)  Tamsulosin (Flomax)  Terazosin (Hytrin)  Triamterene (Maxzide) ==================================================================== For clinical consultation, please call [phone]. ====================================================================      Laboratory Chemistry Profile   Renal Lab Results  Component Value Date   BUN 15 01/29/2020   CREATININE 0.93 01/29/2020   BCR 16 01/29/2020   GFRAA 93 01/29/2020   GFRNONAA 80 01/29/2020    Hepatic Lab Results  Component Value Date   AST 18 01/29/2020   ALBUMIN 4.4 01/29/2020   ALKPHOS 50 01/29/2020    Electrolytes Lab Results  Component Value Date   NA 142 01/29/2020   K 4.8 01/29/2020   CL 102  01/29/2020   CALCIUM 9.4 01/29/2020   MG 2.3 01/29/2020  Bone Lab Results  Component Value Date   25OHVITD1 54 01/29/2020   25OHVITD2 <1.0 01/29/2020   25OHVITD3 54 01/29/2020    Inflammation (CRP: Acute Phase) (ESR: Chronic Phase) Lab Results  Component Value Date   CRP <1 01/29/2020   ESRSEDRATE 2 01/29/2020         Note: Above Lab results reviewed.  Imaging  DG PAIN CLINIC C-ARM 1-60 MIN NO REPORT Fluoro was used, but no Radiologist interpretation will be provided.  Please refer to "NOTES" tab for provider progress note.  Assessment  The primary encounter diagnosis was Cervicalgia. 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  Plan of Care  Problem-specific:  No problem-specific Assessment & Plan notes found for this encounter.  Albert Gordon has a current medication list which includes the following long-term medication(s): ropinirole, triamterene-hydrochlorothiazide, and melatonin.  Pharmacotherapy (Medications Ordered): No orders of the defined types were placed in this encounter.  Orders:  No orders of the defined types were placed in this encounter.  Follow-up plan:   Return if symptoms worsen or fail to improve.     Interventional Therapies  Risk   Complexity Considerations:   Estimated body mass index is 24.11 kg/m as calculated from the following:   Height as of this encounter: '5\' 10"'  (1.778 m).   Weight as of this encounter: 168 lb (76.2 kg). WNL   Planned   Pending:   Diagnostic bilateral cervical facet MBB #2    Under consideration:   Diagnostic/therapeutic bilateral cervical facet MBB #1  Diagnostic/therapeutic right L3 and L4 TFESI #1    Completed:   Diagnostic bilateral cervical facet MBB x1 (06/26/2021) (100/100/50/50)  Diagnostic bilateral lumbar  facet MBB x2 (05/23/2020) (100/100/70/>50)  Diagnostic bilateral SI joint Blk x2 (02/13/2021) (100/100/100 x3 to 4 days/50)  Therapeutic left lumbar facet RFA x1 (11/21/2020) (100/100/100/100)  Therapeutic left SI joint RFA x1 (11/21/2020) (100/100/100/100)  Therapeutic right lumbar facet RFA x1 (12/05/2020) (100/100/100/100)  Therapeutic right SI joint RFA x1 (12/05/2020) (100/100/100/100)    Therapeutic   Palliative (PRN) options:   Palliative bilateral lumbar facet RFA #2  Palliative bilateral SI joint RFA #2     Recent Visits Date Type Provider Dept  06/26/21 Procedure visit Milinda Pointer, MD [facility]  06/11/21 Office Visit Milinda Pointer, MD [facility]  06/05/21 Office Visit Milinda Pointer, MD [facility]  Showing recent visits within past 90 days and meeting all other requirements Today's Visits Date Type Provider Dept  07/23/21 Office Visit Milinda Pointer, MD [facility]  Showing today's visits and meeting all other requirements Future Appointments No visits were found meeting these conditions. Showing future appointments within next 90 days and meeting all other requirements  I discussed the assessment and treatment plan with the patient. The patient was provided an opportunity to ask questions and all were answered. The patient agreed with the plan and demonstrated an understanding of the instructions.  Patient advised to call back or seek an in-person evaluation if the symptoms or condition worsens.  Duration of encounter: 35 minutes.  Note by: Gaspar Cola, MD Date: 07/23/2021; Time: 2:47 PM

## 2021-07-22 ENCOUNTER — Encounter: Payer: Self-pay | Admitting: Pain Medicine

## 2021-07-23 ENCOUNTER — Ambulatory Visit: Payer: No Typology Code available for payment source | Attending: Pain Medicine | Admitting: Pain Medicine

## 2021-07-23 ENCOUNTER — Other Ambulatory Visit: Payer: Self-pay

## 2021-07-23 DIAGNOSIS — M25512 Pain in left shoulder: Secondary | ICD-10-CM | POA: Diagnosis not present

## 2021-07-23 DIAGNOSIS — G8929 Other chronic pain: Secondary | ICD-10-CM

## 2021-07-23 DIAGNOSIS — M5030 Other cervical disc degeneration, unspecified cervical region: Secondary | ICD-10-CM

## 2021-07-23 DIAGNOSIS — M542 Cervicalgia: Secondary | ICD-10-CM | POA: Diagnosis not present

## 2021-07-23 DIAGNOSIS — M47812 Spondylosis without myelopathy or radiculopathy, cervical region: Secondary | ICD-10-CM | POA: Diagnosis not present

## 2022-06-19 ENCOUNTER — Telehealth: Payer: Self-pay | Admitting: Pain Medicine

## 2022-06-19 NOTE — Telephone Encounter (Signed)
Patient lvmail asking if we had received referral from the [place] to schedule an appt with Dr Dossie Arbour. I dont see any documentation in the chart about a [place] renewal date. Please advise patient.

## 2022-06-25 NOTE — Telephone Encounter (Signed)
He has not been seen in almost a year. He will need to contact the [place] and have them send [place]  a new referral.

## 2022-07-05 NOTE — Progress Notes (Unsigned)
PROVIDER NOTE: Information contained herein reflects review and annotations entered in association with encounter. Interpretation of such information and data should be left to medically-trained personnel. Information provided to patient can be located elsewhere in the medical record under "Patient Instructions". Document created using STT-dictation technology, any transcriptional errors that may result from process are unintentional.    Patient: Tonia Brooms  Service Category: E/M  Provider: Gaspar Cola, MD  DOB: 1946/07/01  DOS: 07/08/2022  Referring Provider: Ferdie Ping, MD  MRN: [id number]  Specialty: Interventional Pain Management  PCP: Ferdie Ping, MD  Type: Established Patient  Setting: Ambulatory outpatient    Location: Office  Delivery: Face-to-face     HPI  Mr. TYQWAN PINK, a 76 y.o. year old male, is here today because of his No primary diagnosis found.. Mr. Brandenberger primary complain today is No chief complaint on file. Last encounter: My last encounter with him was on 06/19/2022. Pertinent problems: Mr. Van has Chronic pain syndrome; Chronic low back pain (1ry area of Pain) (Bilateral) (L>R) w/o sciatica; Lumbar facet syndrome (Bilateral) (L>R); Chronic lower extremity pain (Bilateral) (L>R); Chronic hip pain (Left); Chronic sacroiliac joint pain (Bilateral) (L>R); Somatic dysfunction of sacroiliac joints (Bilateral); Spondylosis without myelopathy or radiculopathy, lumbosacral region; Lumbar facet arthropathy (Multilevel) (Bilateral); Lumbar facet hypertrophy (Multilevel) (Bilateral); DDD (degenerative disc disease), lumbar; Degenerative joint disease (DJD) of lumbar spine; Osteoarthritis of facet joint of lumbar spine; Osteoarthritis of lumbar spine; 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); Decreased range of motion of lumbar spine; Enthesopathy of sacroiliac joint (Bilateral); Chronic musculoskeletal pain; Trochanteric bursitis, left hip; Other spondylosis,  sacral and sacrococcygeal region; Chronic sacroiliac joint pain (Left); Restless leg syndrome; Lumbosacral radiculopathy at L4 (Right); Absent right patellar reflex; Weakness of right leg; Radicular pain of right lower extremity; Acute pain of lower extremity (Right); Right leg numbness; Abnormal MRI, lumbar spine (05/02/2021); Chronic low back pain (Bilateral) w/ sciatica (Right); 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 Pain Assessment: Severity of   is reported as a  /10. Location:    / . Onset:  . Quality:  . Timing:  . Modifying factor(s):  Marland Kitchen Vitals:  vitals were not taken for this visit.  BMI: Estimated body mass index is 23.68 kg/m as calculated from the following:   Height as of 06/26/21: _0  (1.778 m).   Weight as of 06/26/21: 165 lb (74.8 kg).  Reason for encounter: medication management. ***  Pharmacotherapy Assessment  Analgesic: No opioid analgesics prescribed by our practice. Morphine ER 15 mg daily (15 MME) + hydrocodone/APAP 10/325 1 tablet every 8 hours (45 MME/day) written by the "[employer]" MME/day: 45 mg/day   Monitoring: [place] PMP: PDMP reviewed during this encounter.       Pharmacotherapy: No side-effects or adverse reactions reported. Compliance: No problems identified. Effectiveness: Clinically acceptable.  No notes on file  No results found for: "CBDTHCR" No results found for: "D8THCCBX" No results found for: "D9THCCBX"  UDS:  Summary  Date Value Ref Range Status  01/29/2020 Note  Final     Comment:    ==================================================================== Compliance Drug Analysis, Ur ==================================================================== Test  Result       Flag       Units  Drug Present and Declared for Prescription Verification   Morphine                       3126         EXPECTED   ng/mg creat   Normorphine                    100          EXPECTED   ng/mg creat    Potential sources of morphine include administration of codeine or    morphine, use of heroin, or ingestion of poppy seeds.     Normorphine is an expected metabolite of morphine.    Hydrocodone                    187          EXPECTED   ng/mg creat   Dihydrocodeine                 169          EXPECTED   ng/mg creat   Norhydrocodone                 3061         EXPECTED   ng/mg creat    Sources of hydrocodone include scheduled prescription medications.    Dihydrocodeine and norhydrocodone are expected metabolites of    hydrocodone. Dihydrocodeine is also available as a scheduled    prescription medication.    Acetaminophen                  PRESENT      EXPECTED ==================================================================== Test                      Result    Flag   Units      Ref Range   Creatinine              61               mg/dL      >=20 ==================================================================== Declared Medications:  The flagging and interpretation on this report are based on the  following declared medications.  Unexpected results may arise from  inaccuracies in the declared medications.   **Note: The testing scope of this panel includes these medications:   Hydrocodone (Norco)  Morphine (MSIR)   **Note: The testing scope of this panel does not include small to  moderate amounts of these reported medications:   Acetaminophen (Norco)   **Note: The testing scope of this panel does not include the  following reported  medications:   Atorvastatin (Lipitor)  Hydrochlorothiazide (Maxzide)  Tamsulosin (Flomax)  Terazosin (Hytrin)  Triamterene (Maxzide) ==================================================================== For clinical consultation, please call [phone]. ====================================================================       ROS  Constitutional: Denies any fever or chills Gastrointestinal: No reported hemesis, hematochezia, vomiting, or acute GI distress Musculoskeletal: Denies any acute onset joint swelling, redness, loss of ROM, or weakness Neurological: No reported episodes of acute onset apraxia, aphasia, dysarthria, agnosia, amnesia, paralysis, loss of coordination, or loss of consciousness  Medication Review  HYDROcodone-acetaminophen, Melatonin, atorvastatin, finasteride, morphine, rOPINIRole, salicyclic acid-sulfur, tamsulosin, and triamterene-hydrochlorothiazide  History Review  Allergy: Mr. Pauley has No Known Allergies. Drug: Mr. Kerstetter  reports that he does not currently use drugs. Alcohol:  reports current alcohol use. Tobacco:  reports that he has quit smoking. He has quit using smokeless tobacco. Social: Mr. Biederman  reports that he has quit smoking. He has quit using smokeless tobacco. He reports current alcohol use. He reports that he does not currently use drugs. Medical:  has a past medical history of Hypertension. Surgical: Mr. Tetrick  has a past surgical history that includes Hernia repair. Family: family history is not on file.  Laboratory Chemistry Profile   Renal Lab Results  Component Value Date   BUN 15 01/29/2020   CREATININE 0.93 01/29/2020   BCR 16 01/29/2020   GFRAA 93 01/29/2020   GFRNONAA 80 01/29/2020    Hepatic Lab Results  Component Value Date   AST 18 01/29/2020   ALBUMIN 4.4 01/29/2020   ALKPHOS 50 01/29/2020    Electrolytes Lab Results  Component Value Date   NA 142 01/29/2020   K 4.8 01/29/2020   CL 102 01/29/2020    CALCIUM 9.4 01/29/2020   MG 2.3 01/29/2020    Bone Lab Results  Component Value Date   25OHVITD1 54 01/29/2020   25OHVITD2 <1.0 01/29/2020   25OHVITD3 54 01/29/2020    Inflammation (CRP: Acute Phase) (ESR: Chronic Phase) Lab Results  Component Value Date   CRP <1 01/29/2020   ESRSEDRATE 2 01/29/2020         Note: Above Lab results reviewed.  Recent Imaging Review  DG PAIN CLINIC C-ARM 1-60 MIN NO REPORT Fluoro was used, but no Radiologist interpretation will be provided.  Please refer to "NOTES" tab for provider progress note. Note: Reviewed        Physical Exam  General appearance: Well nourished, well developed, and well hydrated. In no apparent acute distress Mental status: Alert, oriented x 3 (person, place, & time)       Respiratory: No evidence of acute respiratory distress Eyes: PERLA Vitals: There were no vitals taken for this visit. BMI: Estimated body mass index is 23.68 kg/m as calculated from the following:   Height as of 06/26/21: _0  (1.778 m).   Weight as of 06/26/21: 165 lb (74.8 kg). Ideal: Patient weight not recorded  Assessment   Diagnosis Status  No diagnosis found. Controlled Controlled Controlled   Updated Problems: No problems updated.  Plan of Care  Problem-specific:  No problem-specific Assessment & Plan notes found for this encounter.  Mr. MILAN PERKINS has a current medication list which includes the following long-term medication(s): melatonin, ropinirole, and triamterene-hydrochlorothiazide.  Pharmacotherapy (Medications Ordered): No orders of the defined types were placed in this encounter.  Orders:  No orders of the defined types were placed in this encounter.  Follow-up plan:   No follow-ups on file.     Interventional Therapies  Risk  Complexity Considerations:   Estimated body mass index is 24.11 kg/m as calculated from the following:   Height as of this encounter: _1  (1.778 m).   Weight as of this encounter:  168 lb (76.2 kg). WNL   Planned  Pending:   Diagnostic bilateral cervical facet MBB #2    Under consideration:   Diagnostic/therapeutic bilateral cervical facet MBB #1  Diagnostic/therapeutic right L3 and L4 TFESI #1    Completed:   Diagnostic bilateral cervical facet MBB x1 (06/26/2021) (100/100/50/50)  Diagnostic bilateral lumbar facet MBB x2 (05/23/2020) (100/100/70/>50)  Diagnostic bilateral SI joint Blk x2 (02/13/2021) (100/100/100 x3 to 4 days/50)  Therapeutic left lumbar facet RFA x1 (11/21/2020) (100/100/100/100)  Therapeutic left SI joint RFA x1 (11/21/2020) (100/100/100/100)  Therapeutic right  lumbar facet RFA x1 (12/05/2020) (100/100/100/100)  Therapeutic right SI joint RFA x1 (12/05/2020) (100/100/100/100)    Therapeutic  Palliative (PRN) options:   Palliative bilateral lumbar facet RFA #2  Palliative bilateral SI joint RFA #2      Recent Visits No visits were found meeting these conditions. Showing recent visits within past 90 days and meeting all other requirements Future Appointments Date Type Provider Dept  07/08/22 Appointment Milinda Pointer, MD [facility]  Showing future appointments within next 90 days and meeting all other requirements  I discussed the assessment and treatment plan with the patient. The patient was provided an opportunity to ask questions and all were answered. The patient agreed with the plan and demonstrated an understanding of the instructions.  Patient advised to call back or seek an in-person evaluation if the symptoms or condition worsens.  Duration of encounter: *** minutes.  Total time on encounter, as per AMA guidelines included both the face-to-face and non-face-to-face time personally spent by the physician and/or other qualified health care professional(s) on the day of the encounter (includes time in activities that require the physician or other qualified health care professional and does not include time in activities  normally performed by clinical staff). Physician's time may include the following activities when performed: preparing to see the patient (eg, review of tests, pre-charting review of records) obtaining and/or reviewing separately obtained history performing a medically appropriate examination and/or evaluation counseling and educating the patient/family/caregiver ordering medications, tests, or procedures referring and communicating with other health care professionals (when not separately reported) documenting clinical information in the electronic or other health record independently interpreting results (not separately reported) and communicating results to the patient/ family/caregiver care coordination (not separately reported)  Note by: Gaspar Cola, MD Date: 07/08/2022; Time: 11:03 AM

## 2022-07-07 DIAGNOSIS — H903 Sensorineural hearing loss, bilateral: Secondary | ICD-10-CM | POA: Insufficient documentation

## 2022-07-07 DIAGNOSIS — H2512 Age-related nuclear cataract, left eye: Secondary | ICD-10-CM | POA: Insufficient documentation

## 2022-07-07 DIAGNOSIS — N390 Urinary tract infection, site not specified: Secondary | ICD-10-CM | POA: Insufficient documentation

## 2022-07-07 DIAGNOSIS — R339 Retention of urine, unspecified: Secondary | ICD-10-CM | POA: Insufficient documentation

## 2022-07-07 DIAGNOSIS — R338 Other retention of urine: Secondary | ICD-10-CM | POA: Insufficient documentation

## 2022-07-07 DIAGNOSIS — N401 Enlarged prostate with lower urinary tract symptoms: Secondary | ICD-10-CM | POA: Insufficient documentation

## 2022-07-08 ENCOUNTER — Encounter: Payer: Self-pay | Admitting: Pain Medicine

## 2022-07-08 ENCOUNTER — Ambulatory Visit: Payer: No Typology Code available for payment source | Attending: Pain Medicine | Admitting: Pain Medicine

## 2022-07-08 VITALS — BP 157/70 | HR 111 | Temp 97.9°F | Resp 16 | Ht 70.0 in | Wt 170.0 lb

## 2022-07-08 DIAGNOSIS — G894 Chronic pain syndrome: Secondary | ICD-10-CM

## 2022-07-08 DIAGNOSIS — M542 Cervicalgia: Secondary | ICD-10-CM

## 2022-07-08 DIAGNOSIS — G8929 Other chronic pain: Secondary | ICD-10-CM | POA: Diagnosis present

## 2022-07-08 DIAGNOSIS — M47816 Spondylosis without myelopathy or radiculopathy, lumbar region: Secondary | ICD-10-CM

## 2022-07-08 DIAGNOSIS — M545 Low back pain, unspecified: Secondary | ICD-10-CM | POA: Insufficient documentation

## 2022-07-08 DIAGNOSIS — M533 Sacrococcygeal disorders, not elsewhere classified: Secondary | ICD-10-CM | POA: Diagnosis present

## 2022-07-08 DIAGNOSIS — M25552 Pain in left hip: Secondary | ICD-10-CM | POA: Diagnosis present

## 2022-07-08 DIAGNOSIS — R937 Abnormal findings on diagnostic imaging of other parts of musculoskeletal system: Secondary | ICD-10-CM

## 2022-07-08 NOTE — Progress Notes (Signed)
Safety precautions to be maintained throughout the outpatient stay will include: orient to surroundings, keep bed in low position, maintain call bell within reach at all times, provide assistance with transfer out of bed and ambulation.  

## 2022-07-08 NOTE — Patient Instructions (Signed)
______________________________________________________________________  Preparing for your procedure  During your procedure appointment there will be: No Prescription Refills. No disability issues to discussed. No medication changes or discussions.  Instructions: Food intake: Avoid eating anything solid for at least 8 hours prior to your procedure. Clear liquid intake: You may take clear liquids such as water up to 2 hours prior to your procedure. (No carbonated drinks. No soda.) Transportation: Unless otherwise stated by your physician, bring a driver. Morning Medicines: Except for blood thinners, take all of your other morning medications with a sip of water. Make sure to take your heart and blood pressure medicines. If your blood pressure's lower number is above 100, the case will be rescheduled. Blood thinners: If you take a blood thinner, but were not instructed to stop it, call our office [phone] and ask to talk to a nurse. Not stopping a blood thinner prior to certain procedures could lead to serious complications. Diabetics on insulin: Notify the staff so that you can be scheduled 1st case in the morning. If your diabetes requires high dose insulin, take only  of your normal insulin dose the morning of the procedure and notify the staff that you have done so. Preventing infections: Shower with an antibacterial soap the morning of your procedure.  Build-up your immune system: Take 1000 mg of Vitamin C with every meal (3 times a day) the day prior to your procedure. Antibiotics: Inform the nursing staff if you are taking any antibiotics or if you have any conditions that may require antibiotics prior to procedures. (Example: recent joint implants)   Pregnancy: If you are pregnant make sure to notify the nursing staff. Not doing so may result in injury to the fetus, including death.  Sickness: If you have a cold, fever, or any active infections, call and cancel or reschedule your  procedure. Receiving steroids while having an infection may result in complications. Arrival: You must be in the facility at least 30 minutes prior to your scheduled procedure. Tardiness: Your scheduled time is also the cutoff time. If you do not arrive at least 15 minutes prior to your procedure, you will be rescheduled.  Children: Do not bring any children with you. Make arrangements to keep them home. Dress appropriately: There is always a possibility that your clothing may get soiled. Avoid long dresses. Valuables: Do not bring any jewelry or valuables.  Reasons to call and reschedule or cancel your procedure: (Following these recommendations will minimize the risk of a serious complication.) Surgeries: Avoid having procedures within 2 weeks of any surgery. (Avoid for 2 weeks before or after any surgery). Flu Shots: Avoid having procedures within 2 weeks of a flu shots or . (Avoid for 2 weeks before or after immunizations). Barium: Avoid having a procedure within 7-10 days after having had a radiological study involving the use of radiological contrast. (Myelograms, Barium swallow or enema study). Heart attacks: Avoid any elective procedures or surgeries for the initial 6 months after a "Myocardial Infarction" (Heart Attack). Blood thinners: It is imperative that you stop these medications before procedures. Let us know if you if you take any blood thinner.  Infection: Avoid procedures during or within two weeks of an infection (including chest colds or gastrointestinal problems). Symptoms associated with infections include: Localized redness, fever, chills, night sweats or profuse sweating, burning sensation when voiding, cough, congestion, stuffiness, runny nose, sore throat, diarrhea, nausea, vomiting, cold or Flu symptoms, recent or current infections. It is specially important if the infection is   over the area that we intend to treat. Heart and lung problems: Symptoms that may suggest an  active cardiopulmonary problem include: cough, chest pain, breathing difficulties or shortness of breath, dizziness, ankle swelling, uncontrolled high or unusually low blood pressure, and/or palpitations. If you are experiencing any of these symptoms, cancel your procedure and contact your primary care physician for an evaluation.  Remember:  Regular Business hours are:  Monday to Thursday 8:00 AM to 4:00 PM  Provider's Schedule: Davelyn Gwinn, MD:  Procedure days: Tuesday and Thursday 7:30 AM to 4:00 PM  Bilal Lateef, MD:  Procedure days: Monday and Wednesday 7:30 AM to 4:00 PM  ______________________________________________________________________    ____________________________________________________________________________________________  General Risks and Possible Complications  Patient Responsibilities: It is important that you read this as it is part of your informed consent. It is our duty to inform you of the risks and possible complications associated with treatments offered to you. It is your responsibility as a patient to read this and to ask questions about anything that is not clear or that you believe was not covered in this document.  Patient's Rights: You have the right to refuse treatment. You also have the right to change your mind, even after initially having agreed to have the treatment done. However, under this last option, if you wait until the last second to change your mind, you may be charged for the materials used up to that point.  Introduction: Medicine is not an exact science. Everything in Medicine, including the lack of treatment(s), carries the potential for danger, harm, or loss (which is by definition: Risk). In Medicine, a complication is a secondary problem, condition, or disease that can aggravate an already existing one. All treatments carry the risk of possible complications. The fact that a side effects or complications occurs, does not imply  that the treatment was conducted incorrectly. It must be clearly understood that these can happen even when everything is done following the highest safety standards.  No treatment: You can choose not to proceed with the proposed treatment alternative. The "PRO(s)" would include: avoiding the risk of complications associated with the therapy. The "CON(s)" would include: not getting any of the treatment benefits. These benefits fall under one of three categories: diagnostic; therapeutic; and/or palliative. Diagnostic benefits include: getting information which can ultimately lead to improvement of the disease or symptom(s). Therapeutic benefits are those associated with the successful treatment of the disease. Finally, palliative benefits are those related to the decrease of the primary symptoms, without necessarily curing the condition (example: decreasing the pain from a flare-up of a chronic condition, such as incurable terminal cancer).  General Risks and Complications: These are associated to most interventional treatments. They can occur alone, or in combination. They fall under one of the following six (6) categories: no benefit or worsening of symptoms; bleeding; infection; nerve damage; allergic reactions; and/or death. No benefits or worsening of symptoms: In Medicine there are no guarantees, only probabilities. No healthcare provider can ever guarantee that a medical treatment will work, they can only state the probability that it may. Furthermore, there is always the possibility that the condition may worsen, either directly, or indirectly, as a consequence of the treatment. Bleeding: This is more common if the patient is taking a blood thinner, either prescription or over the counter (example: Goody Powders, Fish oil, Aspirin, Garlic, etc.), or if suffering a condition associated with impaired coagulation (example: Hemophilia, cirrhosis of the liver, low platelet counts, etc.). However, even if   you  do not have one on these, it can still happen. If you have any of these conditions, or take one of these drugs, make sure to notify your treating physician. Infection: This is more common in patients with a compromised immune system, either due to disease (example: diabetes, cancer, human immunodeficiency virus [HIV], etc.), or due to medications or treatments (example: therapies used to treat cancer and rheumatological diseases). However, even if you do not have one on these, it can still happen. If you have any of these conditions, or take one of these drugs, make sure to notify your treating physician. Nerve Damage: This is more common when the treatment is an invasive one, but it can also happen with the use of medications, such as those used in the treatment of cancer. The damage can occur to small secondary nerves, or to large primary ones, such as those in the spinal cord and brain. This damage may be temporary or permanent and it may lead to impairments that can range from temporary numbness to permanent paralysis and/or brain death. Allergic Reactions: Any time a substance or material comes in contact with our body, there is the possibility of an allergic reaction. These can range from a mild skin rash (contact dermatitis) to a severe systemic reaction (anaphylactic reaction), which can result in death. Death: In general, any medical intervention can result in death, most of the time due to an unforeseen complication. ____________________________________________________________________________________________    

## 2022-07-16 IMAGING — CR DG SHOULDER 2+V*L*
3 series · 3 of 3 positions shown · non-contrast
Comparison: None.

CLINICAL DATA: Left shoulder pain. Chronic neck pain radiating to
the left shoulder.

EXAM:
LEFT SHOULDER - 2+ VIEW

[shoulder grashey]
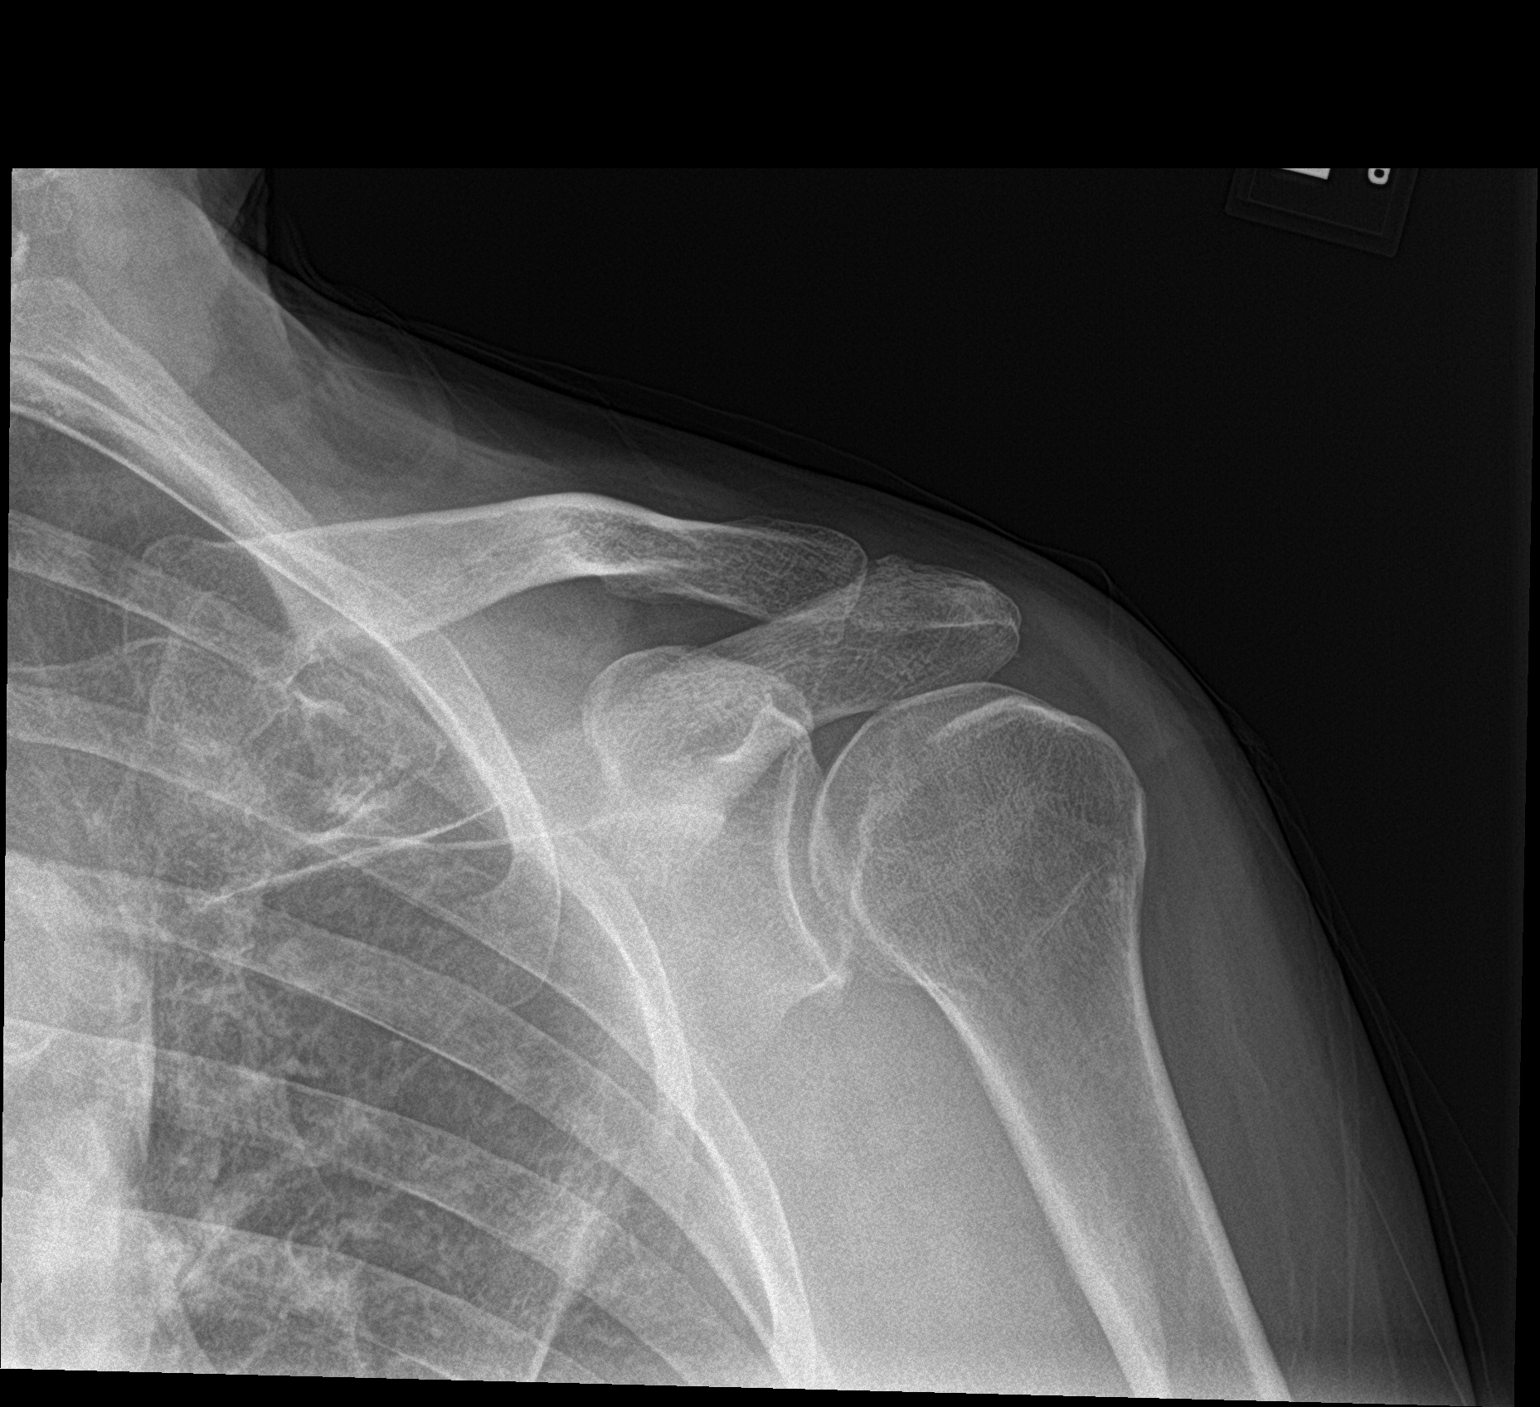

[shoulder y view]
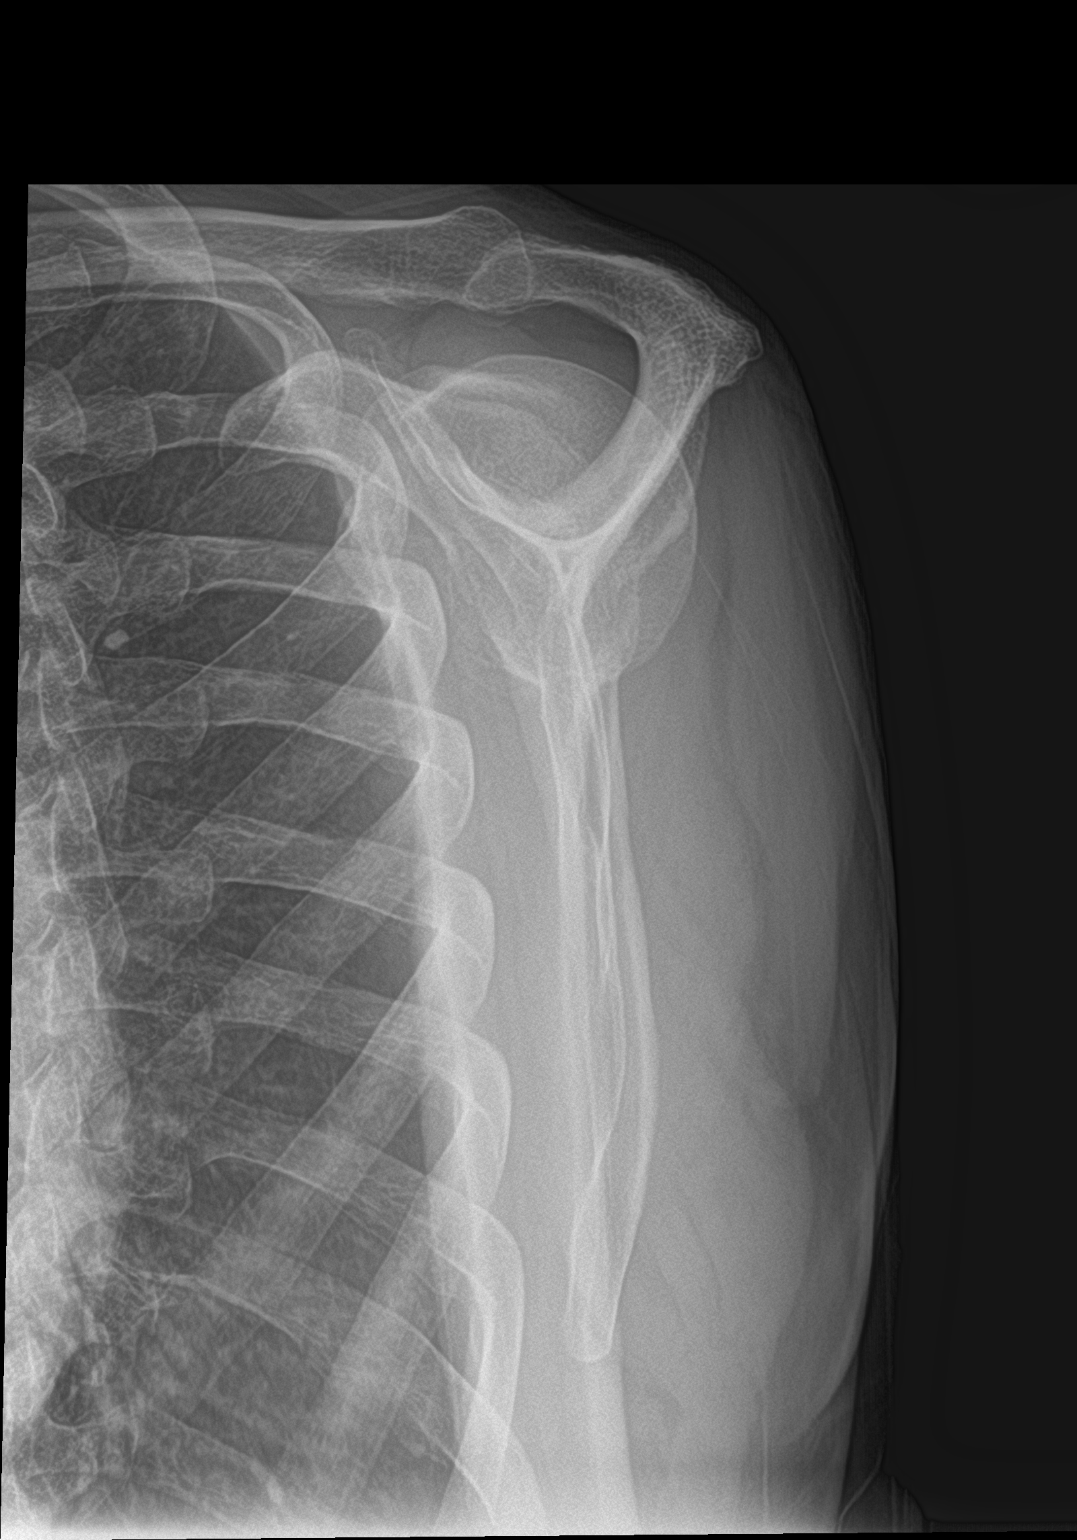

[shoulder axillary]
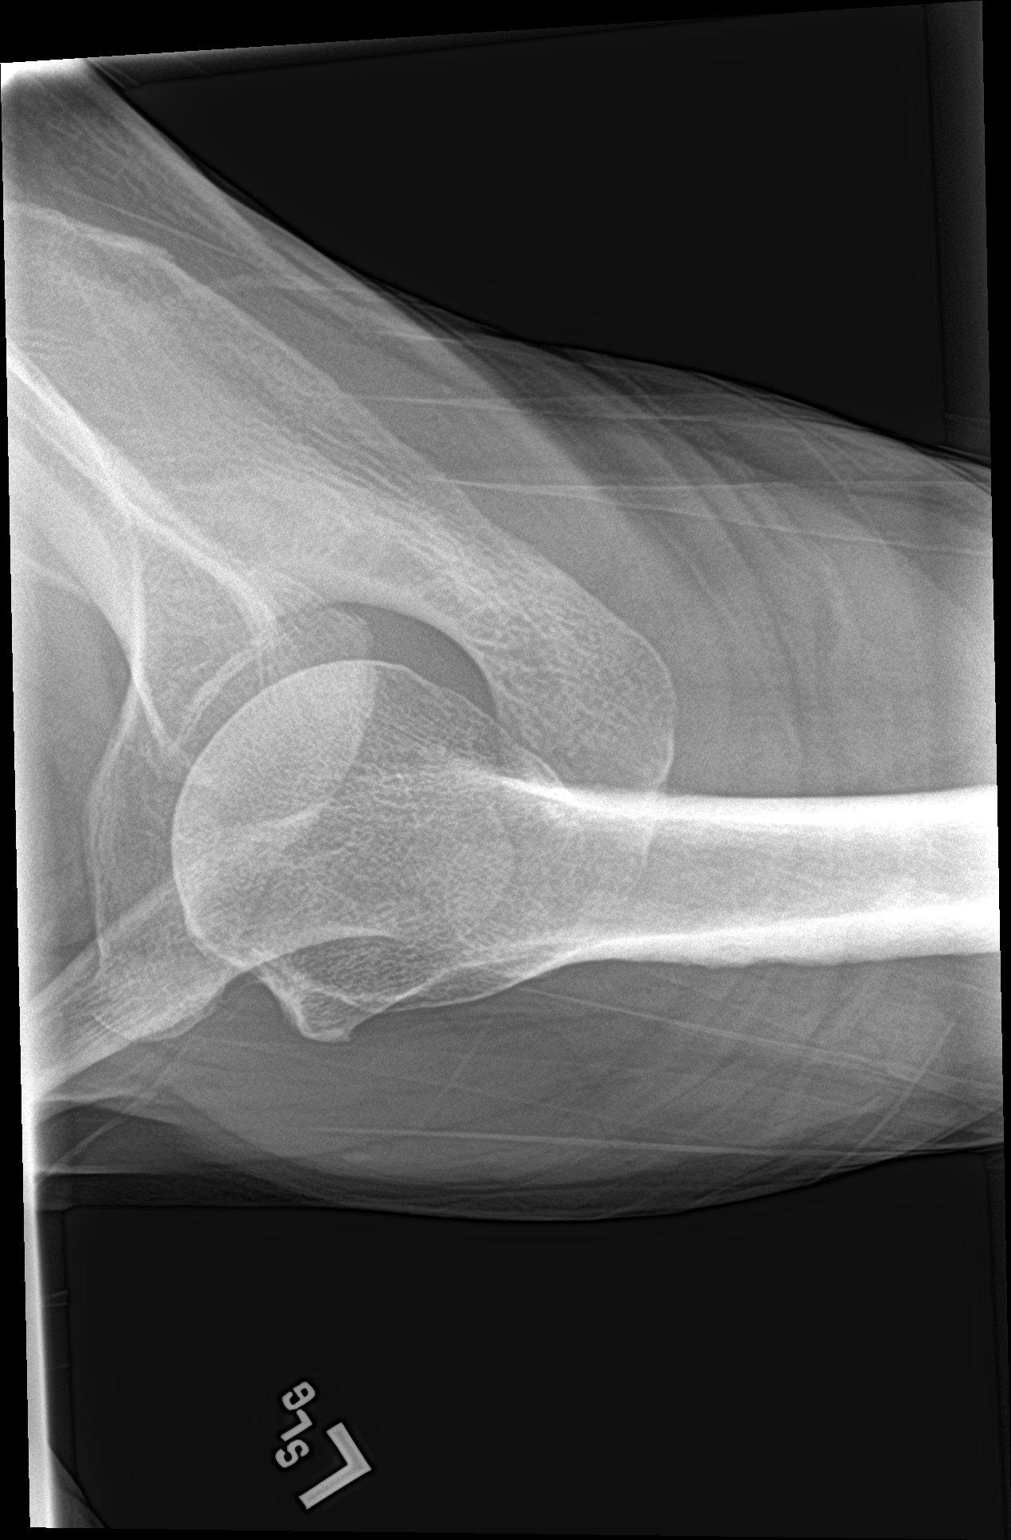

[3 of 3 positions shown; findings below may reference images not displayed]

FINDINGS: There is no evidence of fracture or dislocation. Trace
acromioclavicular spurring. Minimal inferior glenoid spurring. No
erosion, avascular necrosis or focal bone abnormality. Soft tissues
are unremarkable. No soft tissue calcifications.
IMPRESSION: Minimal osteoarthritis of the left shoulder.

## 2022-07-16 IMAGING — CR DG CERVICAL SPINE COMPLETE 4+V
5 series · 5 of 5 positions shown · non-contrast
Comparison: None.

CLINICAL DATA: Cervicalgia. Chronic neck pain radiating into left
shoulder.

EXAM:
CERVICAL SPINE - COMPLETE 4+ VIEW

[c-spine lat]
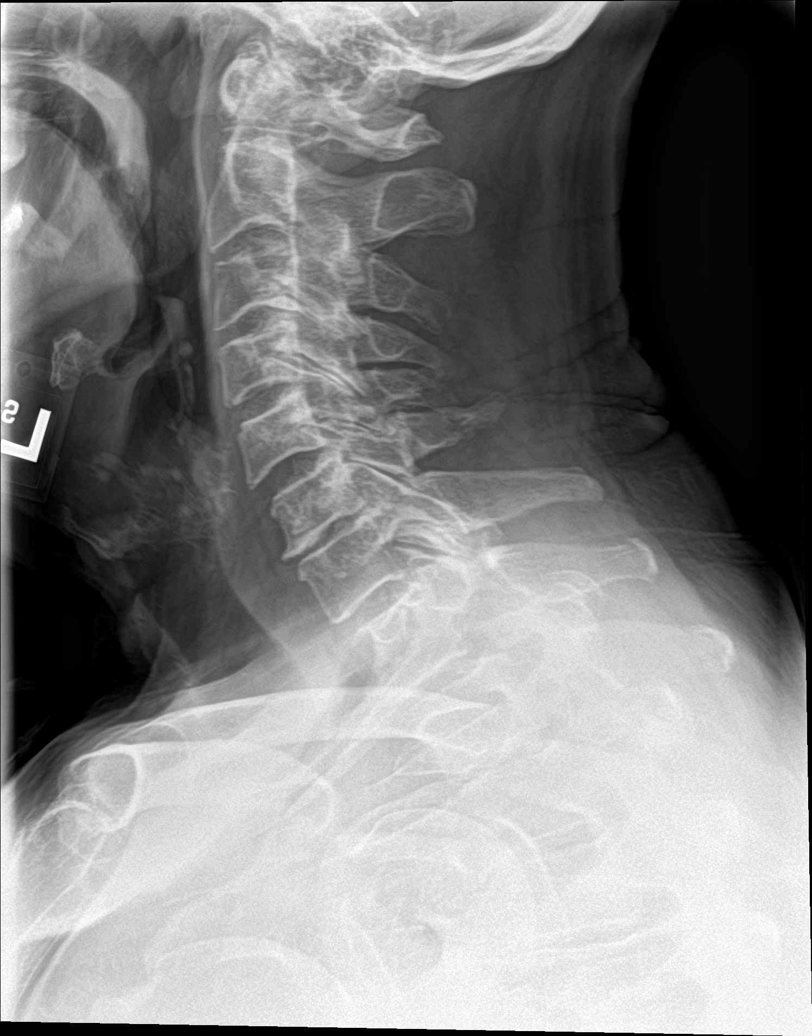

[c-spine obl (1 of 2)]
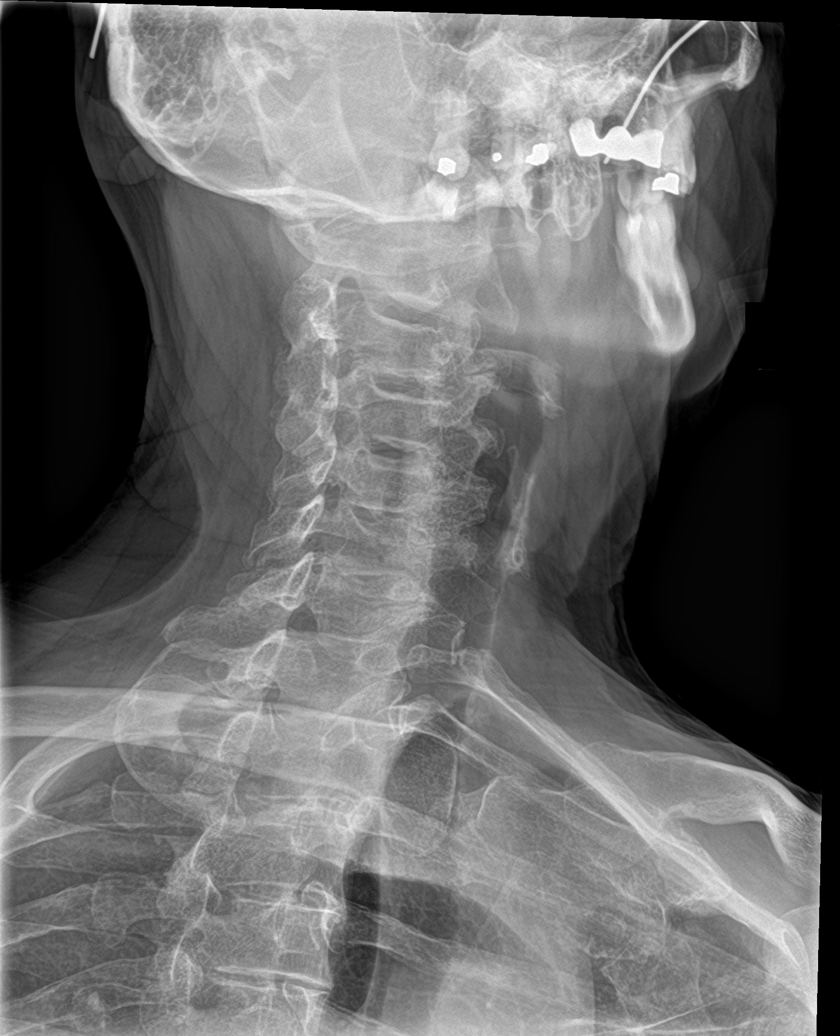

[c-spine obl (2 of 2)]
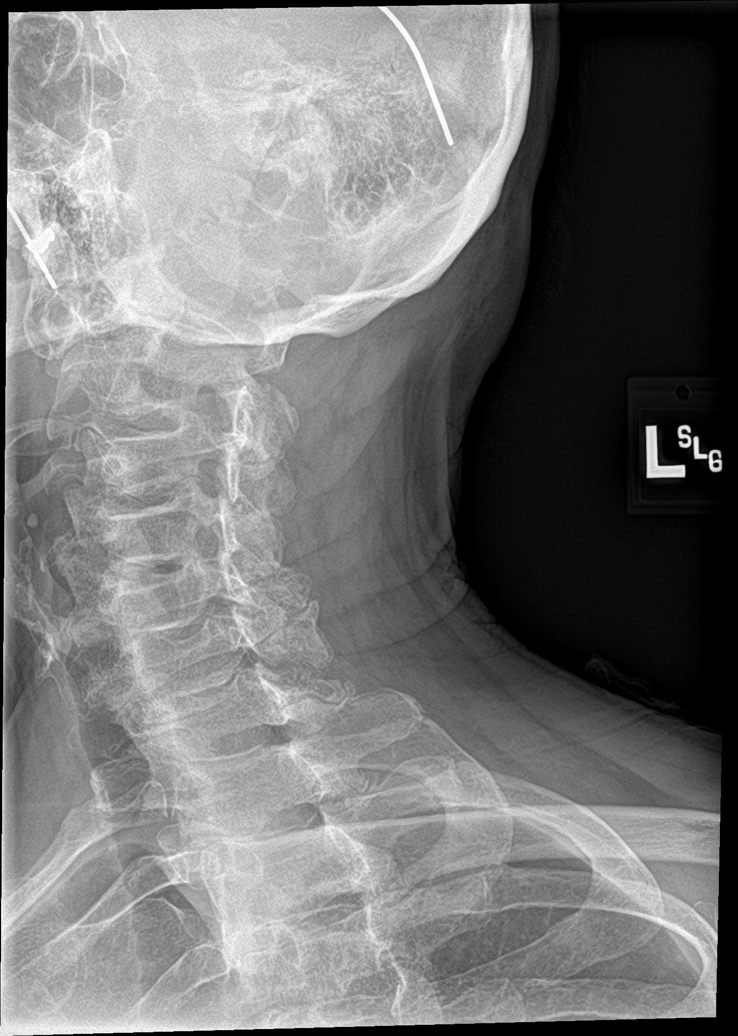

[c-spine ap]
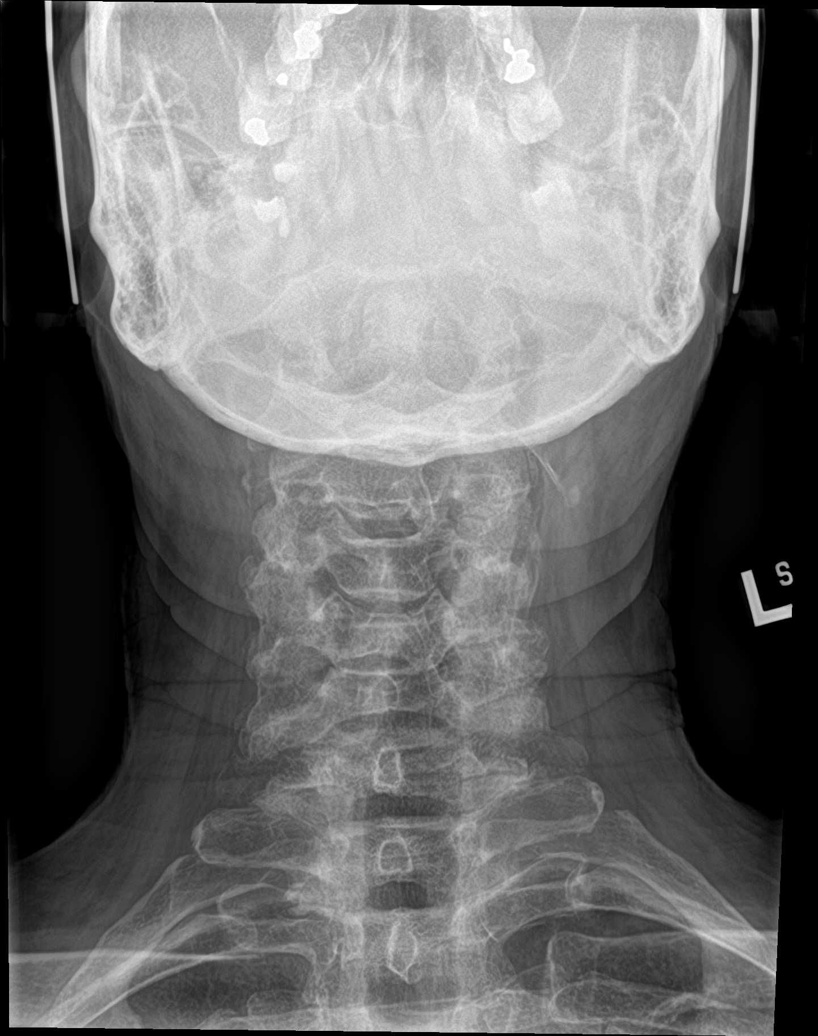

[c-spine open mouth]
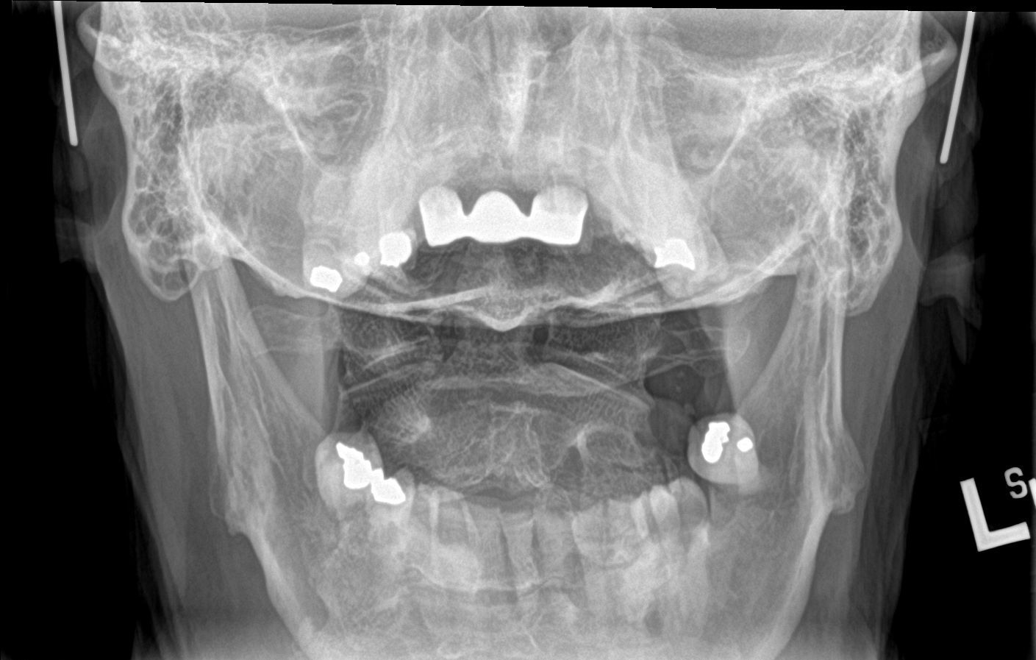

[5 of 5 positions shown; findings below may reference images not displayed]

FINDINGS: 2 mm anterolisthesis of C5 on C6 and C7 on T1. Vertebral body
heights are normal. Disc space narrowing at C6-C7 with endplate
spurring. There is prominent multilevel facet hypertrophy, more
prominent on the left. Bony neural foraminal narrowing at C5-C6 and
C6-C7 on the left, C5-C6 on the right. There is no evidence of
fracture, focal bone lesion or bone destruction. No prevertebral
soft tissue thickening. Lung apices are clear.
IMPRESSION: 1. Prominent multilevel facet hypertrophy.
2. Degenerative disc disease at C6-C7.
3. Bony neural foraminal narrowing at C5-C6 and C6-C7 on the left,
and at C5-C6 on the right.

## 2022-07-26 NOTE — Progress Notes (Addendum)
PROVIDER NOTE: Interpretation of information contained herein should be left to medically-trained personnel. Specific patient instructions are provided elsewhere under "Patient Instructions" section of medical record. This document was created in part using STT-dictation technology, any transcriptional errors that may result from this process are unintentional.  Patient: Albert Gordon Type: Established DOB: 21-Dec-1945 MRN: [id number] PCP: Ferdie Ping, MD  Service: Procedure DOS: 07/28/2022 Setting: Ambulatory Location: Ambulatory outpatient facility Delivery: Face-to-face Provider: Gaspar Cola, MD Specialty: Interventional Pain Management Specialty designation: 09 Location: Outpatient facility Ref. Prov.: D'Silva, Belva Crome, MD    Primary Reason for Visit: Interventional Pain Management Treatment. CC: Back Pain (Left, lower)   Procedure:           Type: Lumbar Facet & Sacroiliac joint Medial & Posterior Branch Radiofrequency Ablation (RFA) #2  Laterality: Left (-LT)  Level: L2, L3, L4, L5, S1, S2, & S3 Medial Branch Level(s). These levels will denervate the L3-4, L4-5, and the L5-S1 lumbar facet joints, as well as the posterior Sacroiliac Joint area.  Imaging: Fluoroscopic guidance Anesthesia: Local anesthesia (1-2% Lidocaine) Anxiolysis: IV Versed         Sedation: Moderate Sedation                       DOS: 07/28/2022  Performed by: Gaspar Cola, MD  Purpose: Therapeutic/Palliative Indications: Low back pain and hip pain severe enough to impact quality of life or function. Rationale (medical necessity): procedure needed and proper for the diagnosis and/or treatment of Albert Gordon medical symptoms and needs. Indications: 1. Lumbar facet syndrome (Bilateral) (L>R)   2. Chronic low back pain (1ry area of Pain) (Bilateral) (L>R) w/o sciatica   3. Chronic sacroiliac joint pain (Left)   4. Spondylosis without myelopathy or radiculopathy, lumbosacral region   5.  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 Osteoarthritis of facet joint of lumbar spine   7. Lumbar facet hypertrophy (Multilevel) (Bilateral)   8. Lumbar facet arthropathy (Multilevel) (Bilateral)   9. Enthesopathy of sacroiliac joint (Bilateral)   10. Somatic dysfunction of sacroiliac joints (Bilateral)   11. DDD (degenerative disc disease), lumbar    Albert Gordon has been dealing with the above chronic pain for longer than three months and has either failed to respond, was unable to tolerate, or simply did not get enough benefit from other more conservative therapies including, but not limited to: 1. Over-the-counter medications 2. Anti-inflammatory medications 3. Muscle relaxants 4. Membrane stabilizers 5. Opioids 6. Physical therapy and/or chiropractic manipulation 7. Modalities (Heat, ice, etc.) 8. Invasive techniques such as nerve blocks. Albert Gordon has attained more than 50% relief of the pain from a series of diagnostic injections conducted in separate occasions.  Pain Score: Pre-procedure: 6 /10 Post-procedure: 4 /10    Target: Medial branch and posterior branch nerve(s) Region: Lumbosacral  Type of procedure: Neurolytic ablation   Position / Prep / Materials:  Position: Prone  Prep solution: DuraPrep (Iodine Povacrylex [0.7% available iodine] and Isopropyl Alcohol, 74% w/w) Prep Area: Entire  Posterior  Lumbosacral  Region  Materials:  Tray:  RFA (Radiofrequency) tray Needle(s):  Type: RFA (Teflon-coated radiofrequency ablation needles)  Gauge (G): 22  Length: Regular (10cm)  Qty: 6  Pre-op H&P Assessment:  Albert Gordon is a 77 y.o. (year old), male patient, seen today for interventional treatment. He  has a past surgical history that includes Hernia repair. Albert Gordon has a current medication list which includes the following prescription(s): aspirin  ec, atorvastatin, finasteride, fluticasone, hydrocodone-acetaminophen, morphine, salicyclic acid-sulfur,  tamsulosin, triamterene-hydrochlorothiazide, melatonin, and ropinirole, and the following Facility-Administered Medications: fentanyl. His primarily concern today is the Back Pain (Left, lower)  Initial Vital Signs:  Pulse/HCG Rate: 64ECG Heart Rate: 66 Temp: (!) 97.4 F (36.3 C) Resp: 18 BP: (!) 166/73 SpO2: 98 %  BMI: Estimated body mass index is 24.39 kg/m as calculated from the following:   Height as of this encounter: '5\' 10"'$  (1.778 m).   Weight as of this encounter: 170 lb (77.1 kg).  Risk Assessment: Allergies: Reviewed. He has No Known Allergies.  Allergy Precautions: None required Coagulopathies: Reviewed. None identified.  Blood-thinner therapy: None at this time Active Infection(s): Reviewed. None identified. Albert Gordon is afebrile  Site Confirmation: Albert Gordon was asked to confirm the procedure and laterality before marking the site Procedure checklist: Completed Consent: Before the procedure and under the influence of no sedative(s), amnesic(s), or anxiolytics, the patient was informed of the treatment options, risks and possible complications. To fulfill our ethical and legal obligations, as recommended by the American Medical Association's Code of Ethics, I have informed the patient of my clinical impression; the nature and purpose of the treatment or procedure; the risks, benefits, and possible complications of the intervention; the alternatives, including doing nothing; the risk(s) and benefit(s) of the alternative treatment(s) or procedure(s); and the risk(s) and benefit(s) of doing nothing. The patient was provided information about the general risks and possible complications associated with the procedure. These may include, but are not limited to: failure to achieve desired goals, infection, bleeding, organ or nerve damage, allergic reactions, paralysis, and death. In addition, the patient was informed of those risks and complications associated to Spine-related  procedures, such as failure to decrease pain; infection (i.e.: Meningitis, epidural or intraspinal abscess); bleeding (i.e.: epidural hematoma, subarachnoid hemorrhage, or any other type of intraspinal or peri-dural bleeding); organ or nerve damage (i.e.: Any type of peripheral nerve, nerve root, or spinal cord injury) with subsequent damage to sensory, motor, and/or autonomic systems, resulting in permanent pain, numbness, and/or weakness of one or several areas of the body; allergic reactions; (i.e.: anaphylactic reaction); and/or death. Furthermore, the patient was informed of those risks and complications associated with the medications. These include, but are not limited to: allergic reactions (i.e.: anaphylactic or anaphylactoid reaction(s)); adrenal axis suppression; blood sugar elevation that in diabetics may result in ketoacidosis or comma; water retention that in patients with history of congestive heart failure may result in shortness of breath, pulmonary edema, and decompensation with resultant heart failure; weight gain; swelling or edema; medication-induced neural toxicity; particulate matter embolism and blood vessel occlusion with resultant organ, and/or nervous system infarction; and/or aseptic necrosis of one or more joints. Finally, the patient was informed that Medicine is not an exact science; therefore, there is also the possibility of unforeseen or unpredictable risks and/or possible complications that may result in a catastrophic outcome. The patient indicated having understood very clearly. We have given the patient no guarantees and we have made no promises. Enough time was given to the patient to ask questions, all of which were answered to the patient's satisfaction. Mr. Pargas has indicated that he wanted to continue with the procedure. Attestation: I, the ordering provider, attest that I have discussed with the patient the benefits, risks, side-effects, alternatives, likelihood of  achieving goals, and potential problems during recovery for the procedure that I have provided informed consent. Date  Time: 07/28/2022  8:02 AM  Pre-Procedure Preparation:  Monitoring: As per clinic protocol. Respiration, ETCO2, SpO2, BP, heart rate and rhythm monitor placed and checked for adequate function Safety Precautions: Patient was assessed for positional comfort and pressure points before starting the procedure. Time-out: I initiated and conducted the "Time-out" before starting the procedure, as per protocol. The patient was asked to participate by confirming the accuracy of the "Time Out" information. Verification of the correct person, site, and procedure were performed and confirmed by me, the nursing staff, and the patient. "Time-out" conducted as per Joint Commission's Universal Protocol (UP.01.01.01). Time: [id number]  Description/Narrative of Procedure:          Rationale (medical necessity): procedure needed and proper for the diagnosis and/or treatment of the patient's medical symptoms and needs. Safety Precautions: Aspiration looking for blood return was conducted prior to all injections. At no point did we inject any substances, as a needle was being advanced. Before injecting, the patient was told to immediately notify me if he was experiencing any new onset of "ringing in the ears, or metallic taste in the mouth". No attempts were made at seeking any paresthesias. Safe injection practices and needle disposal techniques used. Medications properly checked for expiration dates. SDV (single dose vial) medications used. After the completion of the procedure, all disposable equipment used was discarded in the proper designated medical waste containers. Local Anesthesia: Protocol guidelines were followed. The patient was positioned over the fluoroscopy table. The area was prepped in the usual manner. The time-out was completed. The target area was identified using fluoroscopy. A 12-in long,  straight, sterile hemostat was used with fluoroscopic guidance to locate the targets for each level blocked. Once located, the skin was marked with an approved surgical skin marker. Once all sites were marked, the skin (epidermis, dermis, and hypodermis), as well as deeper tissues (fat, connective tissue and muscle) were infiltrated with a small amount of a short-acting local anesthetic, loaded on a 10cc syringe with a 25G, 1.5-in  Needle. An appropriate amount of time was allowed for local anesthetics to take effect before proceeding to the next step.  Technical explanation of process:  Radiofrequency Ablation (RFA) L2 Medial Branch Nerve RFA: The target area for the L2 medial branch is at the junction of the postero-lateral aspect of the superior articular process and the superior, posterior, and medial edge of the transverse process of L3. Under fluoroscopic guidance, a Radiofrequency needle was inserted until contact was made with os over the superior postero-lateral aspect of the pedicular shadow (target area). Sensory and motor testing was conducted to properly adjust the position of the needle. Once satisfactory placement of the needle was achieved, the numbing solution was slowly injected after negative aspiration for blood. 2.0 mL of the nerve block solution was injected without difficulty or complication. After waiting for at least 3 minutes, the ablation was performed. Once completed, the needle was removed intact. L3 Medial Branch Nerve RFA: The target area for the L3 medial branch is at the junction of the postero-lateral aspect of the superior articular process and the superior, posterior, and medial edge of the transverse process of L4. Under fluoroscopic guidance, a Radiofrequency needle was inserted until contact was made with os over the superior postero-lateral aspect of the pedicular shadow (target area). Sensory and motor testing was conducted to properly adjust the position of the needle.  Once satisfactory placement of the needle was achieved, the numbing solution was slowly injected after negative aspiration for blood. 2.0 mL of the nerve block solution was injected  without difficulty or complication. After waiting for at least 3 minutes, the ablation was performed. Once completed, the needle was removed intact. L4 Medial Branch Nerve RFA: The target area for the L4 medial branch is at the junction of the postero-lateral aspect of the superior articular process and the superior, posterior, and medial edge of the transverse process of L5. Under fluoroscopic guidance, a Radiofrequency needle was inserted until contact was made with os over the superior postero-lateral aspect of the pedicular shadow (target area). Sensory and motor testing was conducted to properly adjust the position of the needle. Once satisfactory placement of the needle was achieved, the numbing solution was slowly injected after negative aspiration for blood. 2.0 mL of the nerve block solution was injected without difficulty or complication. After waiting for at least 3 minutes, the ablation was performed. Once completed, the needle was removed intact. L5 Medial Branch Nerve RFA: The target area for the L5 medial branch is at the junction of the postero-lateral aspect of the superior articular process of S1 and the superior, posterior, and medial edge of the sacral ala. Under fluoroscopic guidance, a Radiofrequency needle was inserted until contact was made with os over the superior postero-lateral aspect of the pedicular shadow (target area). Sensory and motor testing was conducted to properly adjust the position of the needle. Once satisfactory placement of the needle was achieved, the numbing solution was slowly injected after negative aspiration for blood. 2.0 mL of the nerve block solution was injected without difficulty or complication. After waiting for at least 3 minutes, the ablation was performed. Once completed, the  needle was removed intact. S1 Primary Dorsal Rami and Lateral Branch Nerve RFA: The target area for the S1 medial branch is located inferior to the junction of the S1 superior articular process and the L5 inferior articular process, posterior, inferior, and lateral to the 6 o'clock position of the L5-S1 facet joint, just superior to the S1 posterior foramen. Under fluoroscopic guidance, the Radiofrequency needle was advanced until contact was made with os over the Target area. Sensory and motor testing was conducted to properly adjust the position of the needle. Once satisfactory placement of the needle was achieved, the numbing solution was slowly injected after negative aspiration for blood. 2.0 mL of the nerve block solution was injected without difficulty or complication. After waiting for at least 3 minutes, the ablation was performed. Once completed, the needle was removed intact. S2 Primary Dorsal Rami and Lateral Branch Nerve RFA: The target area for the S2 medial branch is at the posterior superior lateral of the S2 posterior neural foramen. Under fluoroscopic guidance, the Radiofrequency needle was advanced until contact was made with os over the Target area. Sensory and motor testing was conducted to properly adjust the position of the needle. Once satisfactory placement of the needle was achieved, the numbing solution was slowly injected after negative aspiration for blood. 2.0 mL of the nerve block solution was injected without difficulty or complication. After waiting for at least 3 minutes, the ablation was performed. Once completed, the needle was removed intact. S3 Primary Dorsal Rami and Lateral Branch Nerve RFA: The target area for the S3 medial branch is at the posterior superior lateral of the S3 posterior neural foramen. Under fluoroscopic guidance, the Radiofrequency needle was advanced until contact was made with os over the Target area. Sensory and motor testing was conducted to properly  adjust the position of the needle. Once satisfactory placement of the needle was achieved, the numbing  solution was slowly injected after negative aspiration for blood. 2.0 mL of the nerve block solution was injected without difficulty or complication. After waiting for at least 3 minutes, the ablation was performed. Once completed, the needle was removed intact. Radiofrequency lesioning (ablation):  Radiofrequency Generator: Medtronic AccurianTM AG 1000 RF Generator Sensory Stimulation Parameters: 50 Hz was used to locate & identify the nerve, making sure that the needle was positioned such that there was no sensory stimulation below 0.3 V or above 0.7 V. Motor Stimulation Parameters: 2 Hz was used to evaluate the motor component. Care was taken not to lesion any nerves that demonstrated motor stimulation of the lower extremities at an output of less than 2.5 times that of the sensory threshold, or a maximum of 2.0 V. Lesioning Technique Parameters: Standard Radiofrequency settings. (Not bipolar or pulsed.) Temperature Settings: 80 degrees C Lesioning time: 60 seconds Intra-operative Compliance: Compliant  Once the entire procedure was completed, the treated area was cleaned, making sure to leave some of the prepping solution back to take advantage of its long term bactericidal properties.    Illustration of the posterior view of the lumbar spine and the posterior neural structures. Laminae of L2 through S1 are labeled. DPRL5, dorsal primary ramus of L5; DPRS1, dorsal primary ramus of S1; DPR3, dorsal primary ramus of L3; FJ, facet (zygapophyseal) joint L3-L4; I, inferior articular process of L4; LB1, lateral branch of dorsal primary ramus of L1; IAB, inferior articular branches from L3 medial branch (supplies L4-L5 facet joint); IBP, intermediate branch plexus; MB3, medial branch of dorsal primary ramus of L3; NR3, third lumbar nerve root; S, superior articular process of L5; SAB, superior articular  branches from L4 (supplies L4-5 facet joint also); TP3, transverse process of L3.  Vitals:   07/28/22 0855 07/28/22 0900 07/28/22 0907 07/28/22 0918  BP: 132/71 137/74 131/72 (!) 156/88  Pulse: (!) 56 (!) 56 61   Resp: '18 20 18 16  '$ Temp:      TempSrc:      SpO2: 100% 99% 100% 100%  Weight:      Height:       Start Time: 0833 hrs. End Time: 0907 hrs.  Imaging Guidance:          Type of Imaging Technique: Fluoroscopy Guidance (Spinal) Indication(s): Assistance in needle guidance and placement for procedures requiring needle placement in or near specific anatomical locations not easily accessible without such assistance. Exposure Time: Please see nurses notes. Contrast: None used. Fluoroscopic Guidance: I was personally present during the use of fluoroscopy. "Tunnel Vision Technique" used to obtain the best possible view of the target area. Parallax error corrected before commencing the procedure. "Direction-depth-direction" technique used to introduce the needle under continuous pulsed fluoroscopy. Once target was reached, antero-posterior, oblique, and lateral fluoroscopic projection used confirm needle placement in all planes. Images permanently stored in EMR. Ultrasound Guidance: N/A Interpretation: No contrast injected. I personally interpreted the imaging intraoperatively. Adequate needle placement confirmed in multiple planes. Permanent images saved into the patient's record.  Post-operative Assessment:  Post-procedure Vital Signs:  Pulse/HCG Rate: 61 (NSR)66 Temp: (!) 97.4 F (36.3 C) Resp: 16 BP: (!) 156/88 SpO2: 100 %  EBL: None  Complications: No immediate post-treatment complications observed by team, or reported by patient.  Note: The patient tolerated the entire procedure well. A repeat set of vitals were taken after the procedure and the patient was kept under observation following institutional policy, for this type of procedure. Post-procedural neurological  assessment was performed,  showing return to baseline, prior to discharge. The patient was provided with post-procedure discharge instructions, including a section on how to identify potential problems. Should any problems arise concerning this procedure, the patient was given instructions to immediately contact us, at any time, without hesitation. In any case, we plan to contact the patient by telephone for a follow-up status report regarding this interventional procedure.  Comments:  No additional relevant information.  Plan of Care  Orders:  Orders Placed This Encounter  Procedures   Radiofrequency,Lumbar    Scheduling Instructions:     Side(s): Left-sided     Level: L3-4, L4-5, and L5-S1 Facets (L2, L3, L4, L5, and S1 Medial Branch)     Sedation: With Sedation.     Timeframe: Today    Order Specific Question:   Where will this procedure be performed?    Answer:   [facility]   Radiofrequency Sacroiliac Joint    Scheduling Instructions:     Side(s): Left-sided     Level(s): L4, L5, S1, S2, & S3 Medial Branch Nerve(s)     Sedation: With Sedation.     Timeframe: Today     Procedure: Sacroiliac joint RFA   [facility] C-ARM 1-60 MIN NO REPORT    Intraoperative interpretation by procedural physician at [place].    Standing Status:   Standing    Number of Occurrences:   1    Order Specific Question:   Reason for exam:    Answer:   Assistance in needle guidance and placement for procedures requiring needle placement in or near specific anatomical locations not easily accessible without such assistance.   Informed Consent Details: Physician/Practitioner Attestation; Transcribe to consent form and obtain patient signature    Nursing Order: Transcribe to consent form and obtain patient signature. Note: Always confirm laterality of pain with Mr. Haubner, before procedure.    Order Specific Question:   Physician/Practitioner attestation of informed consent for  procedure/surgical case    Answer:   I, the physician/practitioner, attest that I have discussed with the patient the benefits, risks, side effects, alternatives, likelihood of achieving goals and potential problems during recovery for the procedure that I have provided informed consent.    Order Specific Question:   Procedure    Answer:   Lumbar Facet Radiofrequency Ablation    Order Specific Question:   Physician/Practitioner performing the procedure    Answer:   Meilah Delrosario A. Dossie Arbour, MD    Order Specific Question:   Indication/Reason    Answer:   Low Back Pain, with our without leg pain, due to Facet Joint Arthralgia (Joint Pain) known as Lumbar Facet Syndrome, secondary to Lumbar, and/or Lumbosacral Spondylosis (Arthritis of the Spine), without myelopathy or radiculopathy (Nerve Damage).   Informed Consent Details: Physician/Practitioner Attestation; Transcribe to consent form and obtain patient signature    Nursing Order: Transcribe to consent form and obtain patient signature. Note: Always confirm laterality of pain with Mr. Wuertz, before procedure.    Order Specific Question:   Physician/Practitioner attestation of informed consent for procedure/surgical case    Answer:   I, the physician/practitioner, attest that I have discussed with the patient the benefits, risks, side effects, alternatives, likelihood of achieving goals and potential problems during recovery for the procedure that I have provided informed consent.    Order Specific Question:   Procedure    Answer:   Sacroiliac joint radiofrequency ablation    Order Specific Question:   Physician/Practitioner performing the procedure  Answer:   Thamara Leger A. Dossie Arbour, MD    Order Specific Question:   Indication/Reason    Answer:   Chronic sacroiliac joint pain   Provide equipment / supplies at bedside    Procedure tray: "Radiofrequency Tray" Additional material: Large hemostat (x1); Small hemostat (x1); Towels (x8); 4x4 sterile sponge  pack (x1) Needle type: Teflon-coated Radiofrequency Needle (Disposable  single use) Size: Regular Quantity: 6    Standing Status:   Standing    Number of Occurrences:   1    Order Specific Question:   Specify    Answer:   Radiofrequency Tray   Chronic Opioid Analgesic:  No opioid analgesics prescribed by our practice. Morphine ER 15 mg daily (15 MME) + hydrocodone/APAP 10/325 1 tablet every 8 hours (45 MME/day) written by the [facility]" MME/day: 45 mg/day   Medications ordered for procedure: Meds ordered this encounter  Medications   lidocaine (XYLOCAINE) 2 % (with pres) injection 400 mg   pentafluoroprop-tetrafluoroeth (GEBAUERS) aerosol   lactated ringers infusion   midazolam (VERSED) 5 MG/5ML injection 0.5-2 mg    Make sure Flumazenil is available in the pyxis when using this medication. If oversedation occurs, administer 0.2 mg IV over 15 sec. If after 45 sec no response, administer 0.2 mg again over 1 min; may repeat at 1 min intervals; not to exceed 4 doses (1 mg)   fentaNYL (SUBLIMAZE) injection 25-50 mcg    Make sure Narcan is available in the pyxis when using this medication. In the event of respiratory depression (RR< 8/min): Titrate NARCAN (naloxone) in increments of 0.1 to 0.2 mg IV at 2-3 minute intervals, until desired degree of reversal.   ropivacaine (PF) 2 mg/mL (0.2%) (NAROPIN) injection 18 mL   triamcinolone acetonide (KENALOG-40) injection 80 mg   glycopyrrolate (ROBINUL) injection 0.2 mg   Medications administered: We administered lidocaine, pentafluoroprop-tetrafluoroeth, lactated ringers, midazolam, ropivacaine (PF) 2 mg/mL (0.2%), triamcinolone acetonide, and glycopyrrolate.  See the medical record for exact dosing, route, and time of administration.  Follow-up plan:   Return in about 2 weeks (around 08/11/2022) for (66mn), (ECT): (R) L-FCT + SI RFA #2.       Interventional Therapies  Risk  Complexity Considerations:   WNL   Planned   Pending:   Therapeutic left lumbar facet and SI joint RFA #2    Under consideration:   Diagnostic/therapeutic bilateral cervical facet MBB #2  Diagnostic/therapeutic right L3 and L4 TFESI #1    Completed:   Diagnostic bilateral cervical facet MBB x1 (06/26/2021) (100/100/50/50)  Diagnostic bilateral lumbar facet MBB x2 (05/23/2020) (100/100/70/>50)  Diagnostic bilateral SI joint Blk x2 (02/13/2021) (100/100/100 x3 to 4 days/50)  Therapeutic left lumbar facet RFA x1 (11/21/2020) (100/100/100/100)  Therapeutic left SI joint RFA x1 (11/21/2020) (100/100/100/100)  Therapeutic right lumbar facet RFA x1 (12/05/2020) (100/100/100/100)  Therapeutic right SI joint RFA x1 (12/05/2020) (100/100/100/100)    Therapeutic  Palliative (PRN) options:   Palliative bilateral lumbar facet RFA #2  Palliative bilateral SI joint RFA #2       Recent Visits Date Type Provider Dept  07/08/22 Office Visit NMilinda Pointer MD [facility]  Showing recent visits within past 90 days and meeting all other requirements Today's Visits Date Type Provider Dept  07/28/22 Procedure visit NMilinda Pointer MD [facility]  Showing today's visits and meeting all other requirements Future Appointments Date Type Provider Dept  08/11/22 Appointment NMilinda Pointer MD [facility]  Showing future appointments within next 90 days and meeting  all other requirements  Disposition: Discharge home  Discharge (Date  Time): 07/28/2022; 0924 hrs.   Primary Care Physician: Ferdie Ping, MD Location: [facility] Note by: Gaspar Cola, MD Date: 07/28/2022; Time: 11:53 AM  Disclaimer:  Medicine is not an Chief Strategy Officer. The only guarantee in medicine is that nothing is guaranteed. It is important to note that the decision to proceed with this intervention was based on the information collected from the patient. The Data and conclusions were drawn from the patient's  questionnaire, the interview, and the physical examination. Because the information was provided in large part by the patient, it cannot be guaranteed that it has not been purposely or unconsciously manipulated. Every effort has been made to obtain as much relevant data as possible for this evaluation. It is important to note that the conclusions that lead to this procedure are derived in large part from the available data. Always take into account that the treatment will also be dependent on availability of resources and existing treatment guidelines, considered by other Pain Management Practitioners as being common knowledge and practice, at the time of the intervention. For Medico-Legal purposes, it is also important to point out that variation in procedural techniques and pharmacological choices are the acceptable norm. The indications, contraindications, technique, and results of the above procedure should only be interpreted and judged by a Board-Certified Interventional Pain Specialist with extensive familiarity and expertise in the same exact procedure and technique.

## 2022-07-28 ENCOUNTER — Ambulatory Visit: Payer: No Typology Code available for payment source | Attending: Pain Medicine | Admitting: Pain Medicine

## 2022-07-28 ENCOUNTER — Ambulatory Visit
Admission: RE | Admit: 2022-07-28 | Discharge: 2022-07-28 | Disposition: A | Discharge status: Home / Self Care | Payer: No Typology Code available for payment source | Source: Ambulatory Visit | Attending: Pain Medicine | Admitting: Pain Medicine

## 2022-07-28 VITALS — BP 156/88 | HR 61 | Temp 97.4°F | Resp 16 | Ht 70.0 in | Wt 170.0 lb

## 2022-07-28 DIAGNOSIS — M545 Low back pain, unspecified: Secondary | ICD-10-CM | POA: Insufficient documentation

## 2022-07-28 DIAGNOSIS — M5136 Other intervertebral disc degeneration, lumbar region: Secondary | ICD-10-CM | POA: Diagnosis present

## 2022-07-28 DIAGNOSIS — M9904 Segmental and somatic dysfunction of sacral region: Secondary | ICD-10-CM | POA: Insufficient documentation

## 2022-07-28 DIAGNOSIS — G8929 Other chronic pain: Secondary | ICD-10-CM | POA: Diagnosis present

## 2022-07-28 DIAGNOSIS — M779 Enthesopathy, unspecified: Secondary | ICD-10-CM | POA: Insufficient documentation

## 2022-07-28 DIAGNOSIS — M47816 Spondylosis without myelopathy or radiculopathy, lumbar region: Secondary | ICD-10-CM | POA: Diagnosis not present

## 2022-07-28 DIAGNOSIS — R55 Syncope and collapse: Secondary | ICD-10-CM

## 2022-07-28 DIAGNOSIS — M47817 Spondylosis without myelopathy or radiculopathy, lumbosacral region: Secondary | ICD-10-CM | POA: Diagnosis present

## 2022-07-28 DIAGNOSIS — M533 Sacrococcygeal disorders, not elsewhere classified: Secondary | ICD-10-CM | POA: Diagnosis present

## 2022-07-28 DIAGNOSIS — M4316 Spondylolisthesis, lumbar region: Secondary | ICD-10-CM | POA: Diagnosis present

## 2022-07-28 MED ORDER — LIDOCAINE HCL 2 % IJ SOLN
20.0000 mL | Freq: Once | INTRAMUSCULAR | Status: AC
  Administered 2022-07-28: 400 mg

## 2022-07-28 MED ORDER — FENTANYL CITRATE (PF) 100 MCG/2ML IJ SOLN: 25.0000 ug | INTRAMUSCULAR | Status: DC | PRN

## 2022-07-28 MED ORDER — ROPIVACAINE HCL 2 MG/ML IJ SOLN
18.0000 mL | Freq: Once | INTRAMUSCULAR | Status: AC
  Administered 2022-07-28: 18 mL via PERINEURAL

## 2022-07-28 MED ORDER — FENTANYL CITRATE (PF) 100 MCG/2ML IJ SOLN
INTRAMUSCULAR | Status: AC
  Filled 2022-07-28: qty 2

## 2022-07-28 MED ORDER — PENTAFLUOROPROP-TETRAFLUOROETH EX AERO
INHALATION_SPRAY | Freq: Once | CUTANEOUS | Status: AC
  Administered 2022-07-28: 30 via TOPICAL
  Filled 2022-07-28: qty 116

## 2022-07-28 MED ORDER — TRIAMCINOLONE ACETONIDE 40 MG/ML IJ SUSP
80.0000 mg | Freq: Once | INTRAMUSCULAR | Status: AC
  Administered 2022-07-28: 80 mg

## 2022-07-28 MED ORDER — TRIAMCINOLONE ACETONIDE 40 MG/ML IJ SUSP
INTRAMUSCULAR | Status: AC
  Filled 2022-07-28: qty 1

## 2022-07-28 MED ORDER — LACTATED RINGERS IV SOLN: Freq: Once | INTRAVENOUS | Status: AC

## 2022-07-28 MED ORDER — GLYCOPYRROLATE 0.2 MG/ML IJ SOLN
0.2000 mg | Freq: Once | INTRAMUSCULAR | Status: AC
  Administered 2022-07-28: 0.2 mg via INTRAVENOUS

## 2022-07-28 MED ORDER — MIDAZOLAM HCL 5 MG/5ML IJ SOLN
0.5000 mg | Freq: Once | INTRAMUSCULAR | Status: AC
  Administered 2022-07-28: 2 mg via INTRAVENOUS

## 2022-07-28 MED ORDER — LIDOCAINE HCL 2 % IJ SOLN
INTRAMUSCULAR | Status: AC
  Filled 2022-07-28: qty 20

## 2022-07-28 MED ORDER — ROPIVACAINE HCL 2 MG/ML IJ SOLN
INTRAMUSCULAR | Status: AC
  Filled 2022-07-28: qty 20

## 2022-07-28 MED ORDER — MIDAZOLAM HCL 5 MG/5ML IJ SOLN
INTRAMUSCULAR | Status: AC
  Filled 2022-07-28: qty 5

## 2022-07-28 NOTE — Patient Instructions (Signed)
___________________________________________________________________________________________  Post-Radiofrequency (RF) Discharge Instructions  You have just completed a Radiofrequency Neurotomy.  The following instructions will provide you with information and guidelines for self-care upon discharge.  If at any time you have questions or concerns please call your physician. DO NOT DRIVE YOURSELF!!  Instructions:  Apply ice: Fill a plastic sandwich bag with crushed ice. Cover it with a small towel and apply to injection site. Apply for 15 minutes then remove x 15 minutes. Repeat sequence on day of procedure, until you go to bed. The purpose is to minimize swelling and discomfort after procedure.  Apply heat: Apply heat to procedure site starting the day following the procedure. The purpose is to treat any soreness and discomfort from the procedure.  Food intake: No eating limitations, unless stipulated above.  Nevertheless, if you have had sedation, you may experience some nausea.  In this case, it may be wise to wait at least two hours prior to resuming regular diet.  Physical activities: Keep activities to a minimum for the first 8 hours after the procedure. For the first 24 hours after the procedure, do not drive a motor vehicle,  Operate heavy machinery, power tools, or handle any weapons.  Consider walking with the use of an assistive device or accompanied by an adult for the first 24 hours.  Do not drink alcoholic beverages including beer.  Do not make any important decisions or sign any legal documents. Go home and rest today.  Resume activities tomorrow, as tolerated.  Use caution in moving about as you may experience mild leg weakness.  Use caution in cooking, use of household electrical appliances and climbing steps.  Driving: If you have received any sedation, you are not allowed to drive for 24 hours after your procedure.  Blood thinner: Restart your blood thinner 6 hours after your  procedure. (Only for those taking blood thinners)  Insulin: As soon as you can eat, you may resume your normal dosing schedule. (Only for those taking insulin)  Medications: May resume pre-procedure medications.  Do not take any drugs, other than what has been prescribed to you.  Infection prevention: Keep procedure site clean and dry.  Post-procedure Pain Diary: Extremely important that this be done correctly and accurately. Recorded information will be used to determine the next step in treatment.  Pain evaluated is that of treated area only. Do not include pain from an untreated area.  Complete every hour, on the hour, for the initial 8 hours. Set an alarm to help you do this part accurately.  Do not go to sleep and have it completed later. It will not be accurate.  Follow-up appointment: Keep your follow-up appointment after the procedure. Usually 2-6 weeks after radiofrequency. Bring you pain diary. The information collected will be essential for your long-term care.   Expect:  From numbing medicine (AKA: Local Anesthetics): Numbness or decrease in pain.  Onset: Full effect within 15 minutes of injected.  Duration: It will depend on the type of local anesthetic used. On the average, 1 to 8 hours.   From steroids (when added): Decrease in swelling or inflammation. Once inflammation is improved, relief of the pain will follow.  Onset of benefits: Depends on the amount of swelling present. The more swelling, the longer it will take for the benefits to be seen. In some cases, up to 10 days.  Duration: Steroids will stay in the system x 2 weeks. Duration of benefits will depend on multiple posibilities including persistent irritating factors.    this may last as long as 6 weeks. Additional post-procedure pain medication is provided for this. Discomfort is minimized if ice and heat are applied as  instructed.  Call if: You experience numbness and weakness that gets worse with time, as opposed to wearing off. He experience any unusual bleeding, difficulty breathing, or loss of the ability to control your bowel and bladder. (This applies to Spinal procedures only) You experience any redness, swelling, heat, red streaks, elevated temperature, fever, or any other signs of a possible infection.  Emergency Numbers: [facility] hours (Monday - Thursday, 8:00 AM - 4:00 PM) (Friday, 9:00 AM - 12:00 Noon): [phone] After hours: [phone] ____________________________________________________________________________________________    ______________________________________________________________________  Preparing for your procedure  During your procedure appointment there will be: No Prescription Refills. No disability issues to discussed. No medication changes or discussions.  Instructions: Food intake: Avoid eating anything solid for at least 8 hours prior to your procedure. Clear liquid intake: You may take clear liquids such as water up to 2 hours prior to your procedure. (No carbonated drinks. No soda.) Transportation: Unless otherwise stated by your physician, bring a driver. Morning Medicines: Except for blood thinners, take all of your other morning medications with a sip of water. Make sure to take your heart and blood pressure medicines. If your blood pressure's lower number is above 100, the case will be rescheduled. Blood thinners: If you take a blood thinner, but were not instructed to stop it, call our office [phone] and ask to talk to a nurse. Not stopping a blood thinner prior to certain procedures could lead to serious complications. Diabetics on insulin: Notify the staff so that you can be scheduled 1st case in the morning. If your diabetes requires high dose insulin, take only  of your normal insulin dose the morning of the procedure and notify the  staff that you have done so. Preventing infections: Shower with an antibacterial soap the morning of your procedure.  Build-up your immune system: Take 1000 mg of Vitamin C with every meal (3 times a day) the day prior to your procedure. Antibiotics: Inform the nursing staff if you are taking any antibiotics or if you have any conditions that may require antibiotics prior to procedures. (Example: recent joint implants)   Pregnancy: If you are pregnant make sure to notify the nursing staff. Not doing so may result in injury to the fetus, including death.  Sickness: If you have a cold, fever, or any active infections, call and cancel or reschedule your procedure. Receiving steroids while having an infection may result in complications. Arrival: You must be in the facility at least 30 minutes prior to your scheduled procedure. Tardiness: Your scheduled time is also the cutoff time. If you do not arrive at least 15 minutes prior to your procedure, you will be rescheduled.  Children: Do not bring any children with you. Make arrangements to keep them home. Dress appropriately: There is always a possibility that your clothing may get soiled. Avoid long dresses. Valuables: Do not bring any jewelry or valuables.  Reasons to call and reschedule or cancel your procedure: (Following these recommendations will minimize the risk of a serious complication.) Surgeries: Avoid having procedures within 2 weeks of any surgery. (Avoid for 2 weeks before or after any surgery). Flu Shots: Avoid having procedures within 2 weeks of a flu shots or . (Avoid for 2 weeks before or after immunizations). Barium: Avoid having a procedure within 7-10 days after having had  a radiological study involving the use of radiological contrast. (Myelograms, Barium swallow or enema study). Heart attacks: Avoid any elective procedures or surgeries for the initial 6 months after a "Myocardial Infarction" (Heart Attack). Blood thinners: It is  imperative that you stop these medications before procedures. Let us know if you if you take any blood thinner.  Infection: Avoid procedures during or within two weeks of an infection (including chest colds or gastrointestinal problems). Symptoms associated with infections include: Localized redness, fever, chills, night sweats or profuse sweating, burning sensation when voiding, cough, congestion, stuffiness, runny nose, sore throat, diarrhea, nausea, vomiting, cold or Flu symptoms, recent or current infections. It is specially important if the infection is over the area that we intend to treat. Heart and lung problems: Symptoms that may suggest an active cardiopulmonary problem include: cough, chest pain, breathing difficulties or shortness of breath, dizziness, ankle swelling, uncontrolled high or unusually low blood pressure, and/or palpitations. If you are experiencing any of these symptoms, cancel your procedure and contact your primary care physician for an evaluation.  Remember:  Regular Business hours are:  Monday to Thursday 8:00 AM to 4:00 PM  Provider's Schedule: Milinda Pointer, MD:  Procedure days: Tuesday and Thursday 7:30 AM to 4:00 PM  Gillis Santa, MD:  Procedure days: Monday and Wednesday 7:30 AM to 4:00 PM  ______________________________________________________________________    ____________________________________________________________________________________________  General Risks and Possible Complications  Patient Responsibilities: It is important that you read this as it is part of your informed consent. It is our duty to inform you of the risks and possible complications associated with treatments offered to you. It is your responsibility as a patient to read this and to ask questions about anything that is not clear or that you believe was not covered in this document.  Patient's Rights: You have the right to refuse treatment. You also have the right to change  your mind, even after initially having agreed to have the treatment done. However, under this last option, if you wait until the last second to change your mind, you may be charged for the materials used up to that point.  Introduction: Medicine is not an Chief Strategy Officer. Everything in Medicine, including the lack of treatment(s), carries the potential for danger, harm, or loss (which is by definition: Risk). In Medicine, a complication is a secondary problem, condition, or disease that can aggravate an already existing one. All treatments carry the risk of possible complications. The fact that a side effects or complications occurs, does not imply that the treatment was conducted incorrectly. It must be clearly understood that these can happen even when everything is done following the highest safety standards.  No treatment: You can choose not to proceed with the proposed treatment alternative. The "PRO(s)" would include: avoiding the risk of complications associated with the therapy. The "CON(s)" would include: not getting any of the treatment benefits. These benefits fall under one of three categories: diagnostic; therapeutic; and/or palliative. Diagnostic benefits include: getting information which can ultimately lead to improvement of the disease or symptom(s). Therapeutic benefits are those associated with the successful treatment of the disease. Finally, palliative benefits are those related to the decrease of the primary symptoms, without necessarily curing the condition (example: decreasing the pain from a flare-up of a chronic condition, such as incurable terminal cancer).  General Risks and Complications: These are associated to most interventional treatments. They can occur alone, or in combination. They fall under one of the following six (6) categories:  no benefit or worsening of symptoms; bleeding; infection; nerve damage; allergic reactions; and/or death. No benefits or worsening of symptoms:  In Medicine there are no guarantees, only probabilities. No healthcare provider can ever guarantee that a medical treatment will work, they can only state the probability that it may. Furthermore, there is always the possibility that the condition may worsen, either directly, or indirectly, as a consequence of the treatment. Bleeding: This is more common if the patient is taking a blood thinner, either prescription or over the counter (example: Goody Powders, Fish oil, Aspirin, Garlic, etc.), or if suffering a condition associated with impaired coagulation (example: Hemophilia, cirrhosis of the liver, low platelet counts, etc.). However, even if you do not have one on these, it can still happen. If you have any of these conditions, or take one of these drugs, make sure to notify your treating physician. Infection: This is more common in patients with a compromised immune system, either due to disease (example: diabetes, cancer, human immunodeficiency virus [HIV], etc.), or due to medications or treatments (example: therapies used to treat cancer and rheumatological diseases). However, even if you do not have one on these, it can still happen. If you have any of these conditions, or take one of these drugs, make sure to notify your treating physician. Nerve Damage: This is more common when the treatment is an invasive one, but it can also happen with the use of medications, such as those used in the treatment of cancer. The damage can occur to small secondary nerves, or to large primary ones, such as those in the spinal cord and brain. This damage may be temporary or permanent and it may lead to impairments that can range from temporary numbness to permanent paralysis and/or brain death. Allergic Reactions: Any time a substance or material comes in contact with our body, there is the possibility of an allergic reaction. These can range from a mild skin rash (contact dermatitis) to a severe systemic reaction  (anaphylactic reaction), which can result in death. Death: In general, any medical intervention can result in death, most of the time due to an unforeseen complication. ____________________________________________________________________________________________

## 2022-07-29 ENCOUNTER — Telehealth: Payer: Self-pay

## 2022-07-29 NOTE — Telephone Encounter (Signed)
Post procedure follow up.  Patient states he is doing well 

## 2022-08-11 ENCOUNTER — Encounter: Payer: Self-pay | Admitting: Pain Medicine

## 2022-08-11 ENCOUNTER — Ambulatory Visit: Payer: No Typology Code available for payment source | Attending: Pain Medicine | Admitting: Pain Medicine

## 2022-08-11 ENCOUNTER — Ambulatory Visit
Admission: RE | Admit: 2022-08-11 | Discharge: 2022-08-11 | Disposition: A | Discharge status: Home / Self Care | Payer: No Typology Code available for payment source | Source: Ambulatory Visit | Attending: Pain Medicine | Admitting: Pain Medicine

## 2022-08-11 VITALS — BP 161/83 | HR 63 | Temp 97.9°F | Resp 14 | Ht 70.0 in | Wt 170.0 lb

## 2022-08-11 DIAGNOSIS — M51369 Other intervertebral disc degeneration, lumbar region without mention of lumbar back pain or lower extremity pain: Secondary | ICD-10-CM

## 2022-08-11 DIAGNOSIS — M533 Sacrococcygeal disorders, not elsewhere classified: Secondary | ICD-10-CM | POA: Diagnosis not present

## 2022-08-11 DIAGNOSIS — M47816 Spondylosis without myelopathy or radiculopathy, lumbar region: Secondary | ICD-10-CM | POA: Diagnosis not present

## 2022-08-11 DIAGNOSIS — M4316 Spondylolisthesis, lumbar region: Secondary | ICD-10-CM | POA: Diagnosis not present

## 2022-08-11 DIAGNOSIS — M779 Enthesopathy, unspecified: Secondary | ICD-10-CM

## 2022-08-11 DIAGNOSIS — G8929 Other chronic pain: Secondary | ICD-10-CM

## 2022-08-11 DIAGNOSIS — M545 Low back pain, unspecified: Secondary | ICD-10-CM

## 2022-08-11 DIAGNOSIS — M5136 Other intervertebral disc degeneration, lumbar region: Secondary | ICD-10-CM | POA: Diagnosis not present

## 2022-08-11 DIAGNOSIS — R55 Syncope and collapse: Secondary | ICD-10-CM

## 2022-08-11 DIAGNOSIS — Z09 Encounter for follow-up examination after completed treatment for conditions other than malignant neoplasm: Secondary | ICD-10-CM | POA: Diagnosis present

## 2022-08-11 DIAGNOSIS — M47817 Spondylosis without myelopathy or radiculopathy, lumbosacral region: Secondary | ICD-10-CM

## 2022-08-11 DIAGNOSIS — M9904 Segmental and somatic dysfunction of sacral region: Secondary | ICD-10-CM | POA: Diagnosis not present

## 2022-08-11 MED ORDER — MIDAZOLAM HCL 5 MG/5ML IJ SOLN
INTRAMUSCULAR | Status: AC
  Filled 2022-08-11: qty 5

## 2022-08-11 MED ORDER — TRIAMCINOLONE ACETONIDE 40 MG/ML IJ SUSP
INTRAMUSCULAR | Status: AC
  Filled 2022-08-11: qty 1

## 2022-08-11 MED ORDER — GLYCOPYRROLATE 0.2 MG/ML IJ SOLN
0.2000 mg | Freq: Once | INTRAMUSCULAR | Status: AC
  Administered 2022-08-11: 0.2 mg via INTRAVENOUS

## 2022-08-11 MED ORDER — PENTAFLUOROPROP-TETRAFLUOROETH EX AERO
INHALATION_SPRAY | Freq: Once | CUTANEOUS | Status: AC
  Administered 2022-08-11: 30 via TOPICAL
  Filled 2022-08-11: qty 116

## 2022-08-11 MED ORDER — ROPIVACAINE HCL 2 MG/ML IJ SOLN
18.0000 mL | Freq: Once | INTRAMUSCULAR | Status: AC
  Administered 2022-08-11: 18 mL via PERINEURAL

## 2022-08-11 MED ORDER — LIDOCAINE HCL 2 % IJ SOLN
INTRAMUSCULAR | Status: AC
  Filled 2022-08-11: qty 20

## 2022-08-11 MED ORDER — ROPIVACAINE HCL 2 MG/ML IJ SOLN
INTRAMUSCULAR | Status: AC
  Filled 2022-08-11: qty 20

## 2022-08-11 MED ORDER — MIDAZOLAM HCL 5 MG/5ML IJ SOLN
0.5000 mg | Freq: Once | INTRAMUSCULAR | Status: AC
  Administered 2022-08-11: 2 mg via INTRAVENOUS

## 2022-08-11 MED ORDER — LACTATED RINGERS IV SOLN: Freq: Once | INTRAVENOUS | Status: AC

## 2022-08-11 MED ORDER — TRIAMCINOLONE ACETONIDE 40 MG/ML IJ SUSP
80.0000 mg | Freq: Once | INTRAMUSCULAR | Status: AC
  Administered 2022-08-11: 80 mg

## 2022-08-11 MED ORDER — FENTANYL CITRATE (PF) 100 MCG/2ML IJ SOLN
25.0000 ug | INTRAMUSCULAR | Status: DC | PRN
  Administered 2022-08-11: 25 ug via INTRAVENOUS

## 2022-08-11 MED ORDER — FENTANYL CITRATE (PF) 100 MCG/2ML IJ SOLN
INTRAMUSCULAR | Status: AC
  Filled 2022-08-11: qty 2

## 2022-08-11 MED ORDER — LIDOCAINE HCL 2 % IJ SOLN
20.0000 mL | Freq: Once | INTRAMUSCULAR | Status: AC
  Administered 2022-08-11: 400 mg

## 2022-08-11 NOTE — Patient Instructions (Addendum)
____________________________________________________________________________________________  Virtual Visits   ID our calls: Add these numbers to your list of contacts on your smart phone. Label it as "PAIN Management" Nursing: [phone] (Main) Dr. Dossie Arbour: [phone]   What is a "Virtual Visit"? It is an Automotive engineer (medical visit) that takes place on real time (NOT TEXT or E-MAIL) over the telephone or computer device (desktop, laptop, tablet, smart phone, etc.). It allows for more communication flexibility between the patient and the healthcare provider.  Who decides when these types of visits will be used? The physician.  Who is eligible for these types of visits? Only those patients that can be reliably reached over the telephone.  What do you mean by reliably? We do not have time to call everyone multiple times, therefore those patients that tend to screen calls and then call back later are not suitable candidates for this system. We all hate telemarketers and "Robocalls".  We understand how people are reluctant to pickup on calls from "unknown numbers", therefore, we suggest you add our numbers to your list of contacts. This way, you should be able to identify our calls. All of our numbers are available above.   Who is not eligible? This option is not appropriate for medication management. Patients on controlled substances have to come in for "Face-to-Face" encounters. Monitoring of these substances is mandatory. Virtual visits do not allow for unannounced drug screening tests or pill counts. Not bringing your pills, or the empty bottles, may result in no refill.  When will this type of visits be used? For follow-up after procedures on established patients, as long as they have had the same procedure done before.  Whenever you are physically unable to attend a regular appointment.   Can I request my medication visit to be "Virtual"? Yes. Available on  a limited basis, only if you are unable to physically attend your appointment. However, you may only receive a 30-day prescription. Abuse of this option may result in discontinuation of medication due to inability to properly monitor the therapy.  When will I be called?  You will receive an initial call from [phone], from our nursing staff, one business day prior to your appointment. (For Monday appointments you will be called on Friday.) The purpose of this call is to review your medications and the results of any recent procedure.  If the nursing staff is unable to make contact, your virtual encounter may be canceled and rescheduled to a face-to-face visit.  Your provider will call you on the day of your appointment.  At what time will I be called? Providers will call whenever there is time available. Do not expect calls at any specific time. On the schedule, you will have an appointment time assigned to you however, this is seldom accurate. This is done simply to keep a list of patients to be called. Be advised that calls may come at anytime during the day. Calls start as early as 8:00 AM and go as late as 8:00 PM. This will depend on provider availability. The system is not perfect. If this is inconvenient for you, please request to be changed to an "in-person" appointment.   ____________________________________________________________________________________________    ___________________________________________________________________________________________  Post-Radiofrequency (RF) Discharge Instructions  You have just completed a Radiofrequency Neurotomy.  The following instructions will provide you with information and guidelines for self-care upon discharge.  If at any time you have questions or concerns please call your physician. DO NOT DRIVE YOURSELF!!  Instructions: Apply  ice: Fill a plastic sandwich bag with crushed ice. Cover it with a small towel and apply to injection  site. Apply for 15 minutes then remove x 15 minutes. Repeat sequence on day of procedure, until you go to bed. The purpose is to minimize swelling and discomfort after procedure. Apply heat: Apply heat to procedure site starting the day following the procedure. The purpose is to treat any soreness and discomfort from the procedure. Food intake: No eating limitations, unless stipulated above.  Nevertheless, if you have had sedation, you may experience some nausea.  In this case, it may be wise to wait at least two hours prior to resuming regular diet. Physical activities: Keep activities to a minimum for the first 8 hours after the procedure. For the first 24 hours after the procedure, do not drive a motor vehicle,  Operate heavy machinery, power tools, or handle any weapons.  Consider walking with the use of an assistive device or accompanied by an adult for the first 24 hours.  Do not drink alcoholic beverages including beer.  Do not make any important decisions or sign any legal documents. Go home and rest today.  Resume activities tomorrow, as tolerated.  Use caution in moving about as you may experience mild leg weakness.  Use caution in cooking, use of household electrical appliances and climbing steps. Driving: If you have received any sedation, you are not allowed to drive for 24 hours after your procedure. Blood thinner: Restart your blood thinner 6 hours after your procedure. (Only for those taking blood thinners) Insulin: As soon as you can eat, you may resume your normal dosing schedule. (Only for those taking insulin) Medications: May resume pre-procedure medications.  Do not take any drugs, other than what has been prescribed to you. Infection prevention: Keep procedure site clean and dry. Post-procedure Pain Diary: Extremely important that this be done correctly and accurately. Recorded information will be used to determine the next step in treatment. Pain evaluated is that of treated area  only. Do not include pain from an untreated area. Complete every hour, on the hour, for the initial 8 hours. Set an alarm to help you do this part accurately. Do not go to sleep and have it completed later. It will not be accurate. Follow-up appointment: Keep your follow-up appointment after the procedure. Usually 2-6 weeks after radiofrequency. Bring you pain diary. The information collected will be essential for your long-term care.   Expect: From numbing medicine (AKA: Local Anesthetics): Numbness or decrease in pain. Onset: Full effect within 15 minutes of injected. Duration: It will depend on the type of local anesthetic used. On the average, 1 to 8 hours.  From steroids (when added): Decrease in swelling or inflammation. Once inflammation is improved, relief of the pain will follow. Onset of benefits: Depends on the amount of swelling present. The more swelling, the longer it will take for the benefits to be seen. In some cases, up to 10 days. Duration: Steroids will stay in the system x 2 weeks. Duration of benefits will depend on multiple posibilities including persistent irritating factors. From procedure: Some discomfort is to be expected once the numbing medicine wears off. In the case of radiofrequency procedures, this may last as long as 6 weeks. Additional post-procedure pain medication is provided for this. Discomfort is minimized if ice and heat are applied as instructed.  Call if: You experience numbness and weakness that gets worse with time, as opposed to wearing off. He experience any unusual   bleeding, difficulty breathing, or loss of the ability to control your bowel and bladder. (This applies to Spinal procedures only) You experience any redness, swelling, heat, red streaks, elevated temperature, fever, or any other signs of a possible infection.  Emergency Numbers: [place] hours (Monday - Thursday, 8:00 AM - 4:00 PM) (Friday, 9:00 AM - 12:00 Noon): [phone])  [phone] After hours: [phone] ____________________________________________________________________________________________    ____________________________________________________________________________________________  Patient Information update  To: All of our patients.  Re: Name change.  It has been made official that our current name, "[facility]"   will soon be changed to "[facility]".   The purpose of this change is to eliminate any confusion created by the concept of our practice being a "Medication Management Pain Clinic". In the past this has led to the misconception that we treat pain primarily by the use of prescription medications.  Nothing can be farther from the truth.   Understanding PAIN MANAGEMENT: To further understand what our practice does, you first have to understand that "Pain Management" is a subspecialty that requires additional training once a physician has completed their specialty training, which can be in either Anesthesia, Neurology, Psychiatry, or Physical Medicine and Rehabilitation (PMR). Each one of these contributes to the final approach taken by each physician to the management of their patient's pain. To be a "Pain Management Specialist" you must have first completed one of the specialty trainings below.  Anesthesiologists - trained in clinical pharmacology and interventional techniques such as nerve blockade and regional as well as central neuroanatomy. They are trained to block pain before, during, and after surgical interventions.  Neurologists - trained in the diagnosis and pharmacological treatment of complex neurological conditions, such as Multiple Sclerosis, Parkinson's, spinal cord injuries, and other systemic conditions that may be associated with symptoms that may include but are not limited to pain. They tend to rely primarily on  the treatment of chronic pain using prescription medications.  Psychiatrist - trained in conditions affecting the psychosocial wellbeing of patients including but not limited to depression, anxiety, schizophrenia, personality disorders, addiction, and other substance use disorders that may be associated with chronic pain. They tend to rely primarily on the treatment of chronic pain using prescription medications.   Physical Medicine and Rehabilitation (PMR) physicians, also known as physiatrists - trained to treat a wide variety of medical conditions affecting the brain, spinal cord, nerves, bones, joints, ligaments, muscles, and tendons. Their training is primarily aimed at treating patients that have suffered injuries that have caused severe physical impairment. Their training is primarily aimed at the physical therapy and rehabilitation of those patients. They may also work alongside orthopedic surgeons or neurosurgeons using their expertise in assisting surgical patients to recover after their surgeries.  INTERVENTIONAL PAIN MANAGEMENT is sub-subspecialty of Pain Management.  Our physicians are Board-certified in Anesthesia, Pain Management, and Interventional Pain Management.  This meaning that not only have they been trained and Board-certified in their specialty of Anesthesia, and subspecialty of Pain Management, but they have also received further training in the sub-subspecialty of Interventional Pain Management, in order to become Board-certified as INTERVENTIONAL PAIN MANAGEMENT SPECIALIST.    Mission: Our goal is to use our skills in  [place] as alternatives to the chronic use of prescription opioid medications for the treatment of pain. To make this more clear, we have changed our name to reflect what we do and offer. We will  continue to offer medication management assessment and recommendations, but we will not be taking over any patient's medication  management.  ____________________________________________________________________________________________

## 2022-08-11 NOTE — Progress Notes (Signed)
PROVIDER NOTE: Interpretation of information contained herein should be left to medically-trained personnel. Specific patient instructions are provided elsewhere under "Patient Instructions" section of medical record. This document was created in part using STT-dictation technology, any transcriptional errors that may result from this process are unintentional.  Patient: Albert Gordon Type: Established DOB: 01/23/1946 MRN: [id number] PCP: Ferdie Ping, MD  Service: Procedure DOS: 08/11/2022 Setting: Ambulatory Location: Ambulatory outpatient facility Delivery: Face-to-face Provider: Gaspar Cola, MD Specialty: Interventional Pain Management Specialty designation: 09 Location: Outpatient facility Ref. Prov.: D'Silva, Belva Crome, MD    Procedure:             Type: Lumbar Facet & Sacroiliac joint Medial & Posterior Branch Radiofrequency Ablation (RFA) #2  Laterality: Right (-RT)  Level: L2, L3, L4, L5, S1, S2, & S3 Medial Branch Level(s). These levels will denervate the L3-4, L4-5, and the L5-S1 lumbar facet joints, as well as the posterior Sacroiliac Joint area.  Imaging: Fluoroscopic guidance Anesthesia: Local anesthesia (1-2% Lidocaine) Anxiolysis: IV Versed 2.0 mg Sedation: Moderate Sedation Fentanyl 0.5 mL (25 mcg) DOS: 08/11/2022  Performed by: Gaspar Cola, MD  Purpose: Therapeutic/Palliative Indications: Low back pain and hip pain severe enough to impact quality of life or function. Rationale (medical necessity): procedure needed and proper for the diagnosis and/or treatment of Albert Gordon medical symptoms and needs. Indications: 1. Lumbar facet syndrome (Bilateral) (L>R)   2. Chronic low back pain (1ry area of Pain) (Bilateral) (L>R) w/o sciatica   3. Chronic sacroiliac joint pain (Left)   4. Spondylosis without myelopathy or radiculopathy, lumbosacral region   5. 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 Osteoarthritis of facet joint of lumbar spine    7. Lumbar facet hypertrophy (Multilevel) (Bilateral)   8. Lumbar facet arthropathy (Multilevel) (Bilateral)   9. Enthesopathy of sacroiliac joint (Bilateral)   10. Somatic dysfunction of sacroiliac joints (Bilateral)   11. DDD (degenerative disc disease), lumbar   12. History of Vagal reaction    Albert Gordon has been dealing with the above chronic pain for longer than three months and has either failed to respond, was unable to tolerate, or simply did not get enough benefit from other more conservative therapies including, but not limited to: 1. Over-the-counter medications 2. Anti-inflammatory medications 3. Muscle relaxants 4. Membrane stabilizers 5. Opioids 6. Physical therapy and/or chiropractic manipulation 7. Modalities (Heat, ice, etc.) 8. Invasive techniques such as nerve blocks. Albert Gordon has attained more than 50% relief of the pain from a series of diagnostic injections conducted in separate occasions.  Pain Score: Pre-procedure: 2 /10 Post-procedure: 0-No pain/10    Target: Medial branch and posterior branch nerve(s) Region: Lumbosacral  Type of procedure: Neurolytic ablation   Position / Prep / Materials:  Position: Prone  Prep solution: DuraPrep (Iodine Povacrylex [0.7% available iodine] and Isopropyl Alcohol, 74% w/w) Prep Area: Entire  Posterior  Lumbosacral  Region  Materials:  Tray:  RFA (Radiofrequency) tray Needle(s):  Type: RFA (Teflon-coated radiofrequency ablation needles)  Gauge (G): 22  Length: Regular (10cm)  Qty: 6  Post-procedure evaluation   Type: Lumbar Facet & Sacroiliac joint Medial & Posterior Branch Radiofrequency Ablation (RFA) #2  Laterality: Left (-LT)  Level: L2, L3, L4, L5, S1, S2, & S3 Medial Branch Level(s). These levels will denervate the L3-4, L4-5, and the L5-S1 lumbar facet joints, as well as the posterior Sacroiliac Joint area.  Imaging: Fluoroscopic guidance Anesthesia: Local anesthesia (1-2% Lidocaine) Anxiolysis: IV Versed  Sedation: Moderate Sedation                       DOS: 07/28/2022  Performed by: Gaspar Cola, MD  Purpose: Therapeutic/Palliative Indications: Low back pain and hip pain severe enough to impact quality of life or function.  Pain Score: Pre-procedure: 6 /10 Post-procedure: 4 /10     Effectiveness:  Initial hour after procedure: 100 %. Subsequent 4-6 hours post-procedure: 100 %. Analgesia past initial 6 hours: 50 % (current). Ongoing improvement:  Analgesic: The patient indicates that thus far he has an ongoing 50% relief of his low back pain mainly because we have only done the radiofrequency on the left side.  Today he comes in to have the right side done. Function: Albert Gordon reports improvement in function ROM: Albert Gordon reports improvement in ROM  Pre-op H&P Assessment:  Albert Gordon is a 77 y.o. (year old), male patient, seen today for interventional treatment. He  has a past surgical history that includes Hernia repair. Albert Gordon has a current medication list which includes the following prescription(s): aspirin ec, atorvastatin, finasteride, fluticasone, hydrocodone-acetaminophen, morphine, salicyclic acid-sulfur, tamsulosin, triamterene-hydrochlorothiazide, melatonin, and ropinirole, and the following Facility-Administered Medications: fentanyl. His primarily concern today is the Back Pain  Initial Vital Signs:  Pulse/HCG Rate: 63ECG Heart Rate: (!) 59 (SB) Temp: (!) 97.1 F (36.2 C) Resp: 16 BP: (!) 160/78 SpO2: 100 %  BMI: Estimated body mass index is 24.39 kg/m as calculated from the following:   Height as of this encounter: '5\' 10"'$  (1.778 m).   Weight as of this encounter: 170 lb (77.1 kg).  Risk Assessment: Allergies: Reviewed. He has No Known Allergies.  Allergy Precautions: None required Coagulopathies: Reviewed. None identified.  Blood-thinner therapy: None at this time Active Infection(s): Reviewed. None identified. Albert Gordon is afebrile  Site  Confirmation: Albert Gordon was asked to confirm the procedure and laterality before marking the site Procedure checklist: Completed Consent: Before the procedure and under the influence of no sedative(s), amnesic(s), or anxiolytics, the patient was informed of the treatment options, risks and possible complications. To fulfill our ethical and legal obligations, as recommended by the American Medical Association's Code of Ethics, I have informed the patient of my clinical impression; the nature and purpose of the treatment or procedure; the risks, benefits, and possible complications of the intervention; the alternatives, including doing nothing; the risk(s) and benefit(s) of the alternative treatment(s) or procedure(s); and the risk(s) and benefit(s) of doing nothing. The patient was provided information about the general risks and possible complications associated with the procedure. These may include, but are not limited to: failure to achieve desired goals, infection, bleeding, organ or nerve damage, allergic reactions, paralysis, and death. In addition, the patient was informed of those risks and complications associated to Spine-related procedures, such as failure to decrease pain; infection (i.e.: Meningitis, epidural or intraspinal abscess); bleeding (i.e.: epidural hematoma, subarachnoid hemorrhage, or any other type of intraspinal or peri-dural bleeding); organ or nerve damage (i.e.: Any type of peripheral nerve, nerve root, or spinal cord injury) with subsequent damage to sensory, motor, and/or autonomic systems, resulting in permanent pain, numbness, and/or weakness of one or several areas of the body; allergic reactions; (i.e.: anaphylactic reaction); and/or death. Furthermore, the patient was informed of those risks and complications associated with the medications. These include, but are not limited to: allergic reactions (i.e.: anaphylactic or anaphylactoid reaction(s)); adrenal axis suppression;  blood sugar elevation that in diabetics may result in  ketoacidosis or comma; water retention that in patients with history of congestive heart failure may result in shortness of breath, pulmonary edema, and decompensation with resultant heart failure; weight gain; swelling or edema; medication-induced neural toxicity; particulate matter embolism and blood vessel occlusion with resultant organ, and/or nervous system infarction; and/or aseptic necrosis of one or more joints. Finally, the patient was informed that Medicine is not an exact science; therefore, there is also the possibility of unforeseen or unpredictable risks and/or possible complications that may result in a catastrophic outcome. The patient indicated having understood very clearly. We have given the patient no guarantees and we have made no promises. Enough time was given to the patient to ask questions, all of which were answered to the patient's satisfaction. Albert Gordon has indicated that he wanted to continue with the procedure. Attestation: I, the ordering provider, attest that I have discussed with the patient the benefits, risks, side-effects, alternatives, likelihood of achieving goals, and potential problems during recovery for the procedure that I have provided informed consent. Date  Time: 08/11/2022  8:11 AM  Pre-Procedure Preparation:  Monitoring: As per clinic protocol. Respiration, ETCO2, SpO2, BP, heart rate and rhythm monitor placed and checked for adequate function Safety Precautions: Patient was assessed for positional comfort and pressure points before starting the procedure. Time-out: I initiated and conducted the "Time-out" before starting the procedure, as per protocol. The patient was asked to participate by confirming the accuracy of the "Time Out" information. Verification of the correct person, site, and procedure were performed and confirmed by me, the nursing staff, and the patient. "Time-out" conducted as per Joint  Commission's Universal Protocol (UP.01.01.01). Time: [phone]  Description/Narrative of Procedure:          Rationale (medical necessity): procedure needed and proper for the diagnosis and/or treatment of the patient's medical symptoms and needs. Procedural Technique Safety Precautions: Aspiration looking for blood return was conducted prior to all injections. At no point did we inject any substances, as a needle was being advanced. No attempts were made at seeking any paresthesias. Safe injection practices and needle disposal techniques used. Medications properly checked for expiration dates. SDV (single dose vial) medications used. Local Anesthesia: Protocol guidelines were followed. The patient was positioned over the fluoroscopy table. The area was prepped in the usual manner. The time-out was completed. The target area was identified using fluoroscopy. A 12-in long, straight, sterile hemostat was used with fluoroscopic guidance to locate the targets for each level blocked. Once located, the skin was marked with an approved surgical skin marker. Once all sites were marked, the skin (epidermis, dermis, and hypodermis), as well as deeper tissues (fat, connective tissue and muscle) were infiltrated with a small amount of a short-acting local anesthetic, loaded on a 10cc syringe with a 25G, 1.5-in  Needle. An appropriate amount of time was allowed for local anesthetics to take effect before proceeding to the next step. Technical explanation of process:  Radiofrequency Ablation (RFA) L2 Medial Branch Nerve RFA: The target area for the L2 medial branch is at the junction of the postero-lateral aspect of the superior articular process and the superior, posterior, and medial edge of the transverse process of L3. Under fluoroscopic guidance, a Radiofrequency needle was inserted until contact was made with os over the superior postero-lateral aspect of the pedicular shadow (target area). Sensory and motor testing was  conducted to properly adjust the position of the needle. Once satisfactory placement of the needle was achieved, the numbing solution was slowly  injected after negative aspiration for blood. 2.0 mL of the nerve block solution was injected without difficulty or complication. After waiting for at least 3 minutes, the ablation was performed. Once completed, the needle was removed intact. L3 Medial Branch Nerve RFA: The target area for the L3 medial branch is at the junction of the postero-lateral aspect of the superior articular process and the superior, posterior, and medial edge of the transverse process of L4. Under fluoroscopic guidance, a Radiofrequency needle was inserted until contact was made with os over the superior postero-lateral aspect of the pedicular shadow (target area). Sensory and motor testing was conducted to properly adjust the position of the needle. Once satisfactory placement of the needle was achieved, the numbing solution was slowly injected after negative aspiration for blood. 2.0 mL of the nerve block solution was injected without difficulty or complication. After waiting for at least 3 minutes, the ablation was performed. Once completed, the needle was removed intact. L4 Medial Branch Nerve RFA: The target area for the L4 medial branch is at the junction of the postero-lateral aspect of the superior articular process and the superior, posterior, and medial edge of the transverse process of L5. Under fluoroscopic guidance, a Radiofrequency needle was inserted until contact was made with os over the superior postero-lateral aspect of the pedicular shadow (target area). Sensory and motor testing was conducted to properly adjust the position of the needle. Once satisfactory placement of the needle was achieved, the numbing solution was slowly injected after negative aspiration for blood. 2.0 mL of the nerve block solution was injected without difficulty or complication. After waiting for at  least 3 minutes, the ablation was performed. Once completed, the needle was removed intact. L5 Medial Branch Nerve RFA: The target area for the L5 medial branch is at the junction of the postero-lateral aspect of the superior articular process of S1 and the superior, posterior, and medial edge of the sacral ala. Under fluoroscopic guidance, a Radiofrequency needle was inserted until contact was made with os over the superior postero-lateral aspect of the pedicular shadow (target area). Sensory and motor testing was conducted to properly adjust the position of the needle. Once satisfactory placement of the needle was achieved, the numbing solution was slowly injected after negative aspiration for blood. 2.0 mL of the nerve block solution was injected without difficulty or complication. After waiting for at least 3 minutes, the ablation was performed. Once completed, the needle was removed intact. S1 Primary Dorsal Rami and Lateral Branch Nerve RFA: The target area for the S1 medial branch is located inferior to the junction of the S1 superior articular process and the L5 inferior articular process, posterior, inferior, and lateral to the 6 o'clock position of the L5-S1 facet joint, just superior to the S1 posterior foramen. Under fluoroscopic guidance, the Radiofrequency needle was advanced until contact was made with os over the Target area. Sensory and motor testing was conducted to properly adjust the position of the needle. Once satisfactory placement of the needle was achieved, the numbing solution was slowly injected after negative aspiration for blood. 2.0 mL of the nerve block solution was injected without difficulty or complication. After waiting for at least 3 minutes, the ablation was performed. Once completed, the needle was removed intact. S2 Primary Dorsal Rami and Lateral Branch Nerve RFA: The target area for the S2 medial branch is at the posterior superior lateral of the S2 posterior neural  foramen. Under fluoroscopic guidance, the Radiofrequency needle was advanced until contact  was made with os over the Target area. Sensory and motor testing was conducted to properly adjust the position of the needle. Once satisfactory placement of the needle was achieved, the numbing solution was slowly injected after negative aspiration for blood. 2.0 mL of the nerve block solution was injected without difficulty or complication. After waiting for at least 3 minutes, the ablation was performed. Once completed, the needle was removed intact. S3 Primary Dorsal Rami and Lateral Branch Nerve RFA: The target area for the S3 medial branch is at the posterior superior lateral of the S3 posterior neural foramen. Under fluoroscopic guidance, the Radiofrequency needle was advanced until contact was made with os over the Target area. Sensory and motor testing was conducted to properly adjust the position of the needle. Once satisfactory placement of the needle was achieved, the numbing solution was slowly injected after negative aspiration for blood. 2.0 mL of the nerve block solution was injected without difficulty or complication. After waiting for at least 3 minutes, the ablation was performed. Once completed, the needle was removed intact. Radiofrequency lesioning (ablation):  Radiofrequency Generator: Medtronic AccurianTM AG 1000 RF Generator Sensory Stimulation Parameters: 50 Hz was used to locate & identify the nerve, making sure that the needle was positioned such that there was no sensory stimulation below 0.3 V or above 0.7 V. Motor Stimulation Parameters: 2 Hz was used to evaluate the motor component. Care was taken not to lesion any nerves that demonstrated motor stimulation of the lower extremities at an output of less than 2.5 times that of the sensory threshold, or a maximum of 2.0 V. Lesioning Technique Parameters: Standard Radiofrequency settings. (Not bipolar or pulsed.) Temperature Settings: 80  degrees C Lesioning time: 60 seconds Intra-operative Compliance: Compliant  Once the entire procedure was completed, the treated area was cleaned, making sure to leave some of the prepping solution back to take advantage of its long term bactericidal properties.        Illustration of the posterior view of the lumbar spine and the posterior neural structures. Laminae of L2 through S1 are labeled. DPRL5, dorsal primary ramus of L5; DPRS1, dorsal primary ramus of S1; DPR3, dorsal primary ramus of L3; FJ, facet (zygapophyseal) joint L3-L4; I, inferior articular process of L4; LB1, lateral branch of dorsal primary ramus of L1; IAB, inferior articular branches from L3 medial branch (supplies L4-L5 facet joint); IBP, intermediate branch plexus; MB3, medial branch of dorsal primary ramus of L3; NR3, third lumbar nerve root; S, superior articular process of L5; SAB, superior articular branches from L4 (supplies L4-5 facet joint also); TP3, transverse process of L3.  Vitals:   08/11/22 0916 08/11/22 0925 08/11/22 0931 08/11/22 0940  BP: (!) 163/75 (!) 131/95 (!) 132/95 (!) 161/83  Pulse:      Resp: '16 11 12 14  '$ Temp:  98.1 F (36.7 C)  97.9 F (36.6 C)  TempSrc:  Temporal  Temporal  SpO2: 99% 100% 100% 98%  Weight:      Height:        Start Time: 0841 hrs. End Time: 0916 hrs.  Imaging Guidance (Spinal):          Type of Imaging Technique: Fluoroscopy Guidance (Spinal) Indication(s): Assistance in needle guidance and placement for procedures requiring needle placement in or near specific anatomical locations not easily accessible without such assistance. Exposure Time: Please see nurses notes. Contrast: None used. Fluoroscopic Guidance: I was personally present during the use of fluoroscopy. "Tunnel Vision Technique" used to obtain the  best possible view of the target area. Parallax error corrected before commencing the procedure. "Direction-depth-direction" technique used to introduce the  needle under continuous pulsed fluoroscopy. Once target was reached, antero-posterior, oblique, and lateral fluoroscopic projection used confirm needle placement in all planes. Images permanently stored in EMR. Interpretation: No contrast injected. I personally interpreted the imaging intraoperatively. Adequate needle placement confirmed in multiple planes. Permanent images saved into the patient's record.  Post-operative Assessment:  Post-procedure Vital Signs:  Pulse/HCG Rate: 6360 Temp: 97.9 F (36.6 C) Resp: 14 BP: (!) 161/83 SpO2: 98 %  EBL: None  Complications: No immediate post-treatment complications observed by team, or reported by patient.  Note: The patient tolerated the entire procedure well. A repeat set of vitals were taken after the procedure and the patient was kept under observation following institutional policy, for this type of procedure. Post-procedural neurological assessment was performed, showing return to baseline, prior to discharge. The patient was provided with post-procedure discharge instructions, including a section on how to identify potential problems. Should any problems arise concerning this procedure, the patient was given instructions to immediately contact us, at any time, without hesitation. In any case, we plan to contact the patient by telephone for a follow-up status report regarding this interventional procedure.  Comments:  No additional relevant information.  Plan of Care  Orders:  Orders Placed This Encounter  Procedures   Radiofrequency,Lumbar    Scheduling Instructions:     Side(s): Right-sided     Level: L3-4, L4-5, and L5-S1 Facets (L2, L3, L4, L5, and S1 Medial Branch)     Sedation: With Sedation.     Timeframe: Today    Order Specific Question:   Where will this procedure be performed?    Answer:   [facility]   Radiofrequency Sacroiliac Joint    Scheduling Instructions:     Side(s): Right-sided     Level(s): L4, L5, S1,  S2, & S3 Medial Branch Nerve(s)     Sedation: With Sedation.     Timeframe: Today     Procedure: Sacroiliac joint RFA   [facility] C-ARM 1-60 MIN NO REPORT    Intraoperative interpretation by procedural physician at [place].    Standing Status:   Standing    Number of Occurrences:   1    Order Specific Question:   Reason for exam:    Answer:   Assistance in needle guidance and placement for procedures requiring needle placement in or near specific anatomical locations not easily accessible without such assistance.   Informed Consent Details: Physician/Practitioner Attestation; Transcribe to consent form and obtain patient signature    Nursing Order: Transcribe to consent form and obtain patient signature. Note: Always confirm laterality of pain with Mr. Maxcy, before procedure.    Order Specific Question:   Physician/Practitioner attestation of informed consent for procedure/surgical case    Answer:   I, the physician/practitioner, attest that I have discussed with the patient the benefits, risks, side effects, alternatives, likelihood of achieving goals and potential problems during recovery for the procedure that I have provided informed consent.    Order Specific Question:   Procedure    Answer:   Lumbar Facet Radiofrequency Ablation    Order Specific Question:   Physician/Practitioner performing the procedure    Answer:   Aslin Farinas A. Dossie Arbour, MD    Order Specific Question:   Indication/Reason    Answer:   Low Back Pain, with our without leg pain, due to Facet Joint Arthralgia (  Joint Pain) known as Lumbar Facet Syndrome, secondary to Lumbar, and/or Lumbosacral Spondylosis (Arthritis of the Spine), without myelopathy or radiculopathy (Nerve Damage).   Informed Consent Details: Physician/Practitioner Attestation; Transcribe to consent form and obtain patient signature    Nursing Order: Transcribe to consent form and obtain patient signature. Note: Always confirm laterality  of pain with Mr. Kampf, before procedure.    Order Specific Question:   Physician/Practitioner attestation of informed consent for procedure/surgical case    Answer:   I, the physician/practitioner, attest that I have discussed with the patient the benefits, risks, side effects, alternatives, likelihood of achieving goals and potential problems during recovery for the procedure that I have provided informed consent.    Order Specific Question:   Procedure    Answer:   Sacroiliac joint radiofrequency ablation    Order Specific Question:   Physician/Practitioner performing the procedure    Answer:   Jamisyn Langer A. Dossie Arbour, MD    Order Specific Question:   Indication/Reason    Answer:   Chronic sacroiliac joint pain   Provide equipment / supplies at bedside    Procedure tray: "Radiofrequency Tray" Additional material: Large hemostat (x1); Small hemostat (x1); Towels (x8); 4x4 sterile sponge pack (x1) Needle type: Teflon-coated Radiofrequency Needle (Disposable  single use) Size: Regular Quantity: 6    Standing Status:   Standing    Number of Occurrences:   1    Order Specific Question:   Specify    Answer:   Radiofrequency Tray   Nursing Instructions:    Please complete this patient's postprocedure evaluation.    Scheduling Instructions:     Please complete this patient's postprocedure evaluation.   Chronic Opioid Analgesic:  No opioid analgesics prescribed by our practice. Morphine ER 15 mg daily (15 MME) + hydrocodone/APAP 10/325 1 tablet every 8 hours (45 MME/day) written by the "[facility]" MME/day: 45 mg/day   Medications ordered for procedure: Meds ordered this encounter  Medications   lidocaine (XYLOCAINE) 2 % (with pres) injection 400 mg   pentafluoroprop-tetrafluoroeth (GEBAUERS) aerosol   lactated ringers infusion   midazolam (VERSED) 5 MG/5ML injection 0.5-2 mg    Make sure Flumazenil is available in the pyxis when using this medication. If oversedation occurs,  administer 0.2 mg IV over 15 sec. If after 45 sec no response, administer 0.2 mg again over 1 min; may repeat at 1 min intervals; not to exceed 4 doses (1 mg)   fentaNYL (SUBLIMAZE) injection 25-50 mcg    Make sure Narcan is available in the pyxis when using this medication. In the event of respiratory depression (RR< 8/min): Titrate NARCAN (naloxone) in increments of 0.1 to 0.2 mg IV at 2-3 minute intervals, until desired degree of reversal.   ropivacaine (PF) 2 mg/mL (0.2%) (NAROPIN) injection 18 mL   triamcinolone acetonide (KENALOG-40) injection 80 mg   glycopyrrolate (ROBINUL) injection 0.2 mg   Medications administered: We administered lidocaine, pentafluoroprop-tetrafluoroeth, lactated ringers, midazolam, fentaNYL, ropivacaine (PF) 2 mg/mL (0.2%), triamcinolone acetonide, and glycopyrrolate.  See the medical record for exact dosing, route, and time of administration.  Follow-up plan:   Return in about 6 weeks (around 09/22/2022) for Proc-day (T,Th), (VV), (PPE).        Interventional Therapies  Risk  Complexity Considerations:   WNL   Planned  Pending:   Therapeutic left lumbar facet and SI joint RFA #2    Under consideration:   Diagnostic/therapeutic bilateral cervical facet MBB #2  Diagnostic/therapeutic right L3 and L4 TFESI #1  Completed:   Diagnostic bilateral cervical facet MBB x1 (06/26/2021) (100/100/50/50)  Diagnostic bilateral lumbar facet MBB x2 (05/23/2020) (100/100/70/>50)  Diagnostic bilateral SI joint Blk x2 (02/13/2021) (100/100/100 x3 to 4 days/50)  Therapeutic left lumbar facet RFA x1 (11/21/2020) (100/100/100/100)  Therapeutic left SI joint RFA x1 (11/21/2020) (100/100/100/100)  Therapeutic right lumbar facet RFA x1 (12/05/2020) (100/100/100/100)  Therapeutic right SI joint RFA x1 (12/05/2020) (100/100/100/100)    Therapeutic  Palliative (PRN) options:   Palliative bilateral lumbar facet RFA #2  Palliative bilateral SI joint RFA #2      Recent Visits Date  Type Provider Dept  07/28/22 Procedure visit Milinda Pointer, MD [facility]  07/08/22 Office Visit Milinda Pointer, MD [facility]  Showing recent visits within past 90 days and meeting all other requirements Today's Visits Date Type Provider Dept  08/11/22 Procedure visit Milinda Pointer, MD [facility]  Showing today's visits and meeting all other requirements Future Appointments Date Type Provider Dept  09/22/22 Appointment Milinda Pointer, MD [facility]  Showing future appointments within next 90 days and meeting all other requirements  Disposition: Discharge home  Discharge (Date  Time): 08/11/2022;   hrs.   Primary Care Physician: Ferdie Ping, MD Location: [facility] Note by: Gaspar Cola, MD Date: 08/11/2022; Time: 11:10 AM  Disclaimer:  Medicine is not an Chief Strategy Officer. The only guarantee in medicine is that nothing is guaranteed. It is important to note that the decision to proceed with this intervention was based on the information collected from the patient. The Data and conclusions were drawn from the patient's questionnaire, the interview, and the physical examination. Because the information was provided in large part by the patient, it cannot be guaranteed that it has not been purposely or unconsciously manipulated. Every effort has been made to obtain as much relevant data as possible for this evaluation. It is important to note that the conclusions that lead to this procedure are derived in large part from the available data. Always take into account that the treatment will also be dependent on availability of resources and existing treatment guidelines, considered by other Pain Management Practitioners as being common knowledge and practice, at the time of the intervention. For Medico-Legal purposes, it is also important to point out that variation in procedural techniques and  pharmacological choices are the acceptable norm. The indications, contraindications, technique, and results of the above procedure should only be interpreted and judged by a Board-Certified Interventional Pain Specialist with extensive familiarity and expertise in the same exact procedure and technique.

## 2022-08-11 NOTE — Progress Notes (Signed)
Safety precautions to be maintained throughout the outpatient stay will include: orient to surroundings, keep bed in low position, maintain call bell within reach at all times, provide assistance with transfer out of bed and ambulation.  

## 2022-08-12 ENCOUNTER — Telehealth: Payer: Self-pay

## 2022-08-12 NOTE — Telephone Encounter (Signed)
Called PP, denies any needs at this time.

## 2022-09-22 ENCOUNTER — Ambulatory Visit: Payer: No Typology Code available for payment source | Attending: Pain Medicine | Admitting: Pain Medicine

## 2022-09-22 DIAGNOSIS — M533 Sacrococcygeal disorders, not elsewhere classified: Secondary | ICD-10-CM

## 2022-09-22 DIAGNOSIS — M47816 Spondylosis without myelopathy or radiculopathy, lumbar region: Secondary | ICD-10-CM | POA: Diagnosis not present

## 2022-09-22 DIAGNOSIS — G8929 Other chronic pain: Secondary | ICD-10-CM

## 2022-09-22 DIAGNOSIS — M545 Low back pain, unspecified: Secondary | ICD-10-CM | POA: Diagnosis not present

## 2022-09-22 DIAGNOSIS — M4316 Spondylolisthesis, lumbar region: Secondary | ICD-10-CM | POA: Diagnosis not present

## 2022-09-22 NOTE — Progress Notes (Signed)
Patient: Albert Gordon  Service Category: E/M  Provider: Gaspar Cola, MD  DOB: 1945-08-17  DOS: 09/22/2022  Location: Office  MRN: [id number]  Setting: Ambulatory outpatient  Referring Provider: Ferdie Ping, MD  Type: Established Patient  Specialty: Interventional Pain Management  PCP: Ferdie Ping, MD  Location: Remote location  Delivery: TeleHealth     Virtual Encounter - Pain Management PROVIDER NOTE: Information contained herein reflects review and annotations entered in association with encounter. Interpretation of such information and data should be left to medically-trained personnel. Information provided to patient can be located elsewhere in the medical record under "Patient Instructions". Document created using STT-dictation technology, any transcriptional errors that may result from process are unintentional.    Contact & Pharmacy Preferred: [phone] Home: [phone] (home) Mobile: [phone] (mobile) E-mail: No e-mail address on record  [id number] [id number]: [phone]: [phone]  Pre-screening  Mr. DKissickoffered "in-person" vs "virtual" encounter. He indicated preferring virtual for this encounter.   Reason COVID-19*  Social distancing based on CDC and AMA recommendations.   I contacted Albert Gordon 09/22/2022 via telephone.      I clearly identified myself as FGaspar Cola MD. I verified that I was speaking with the correct person using two identifiers (Name: Albert Gordon and date of birth: [birth year].  Consent I sought verbal advanced consent from Albert Broomsfor virtual visit interactions. I informed Albert Gordon possible security and privacy concerns, risks, and limitations associated with providing "not-in-person" medical evaluation and management services. I also informed Mr. DConiglioof the availability of "in-person" appointments. Finally, I informed  him that there would be a charge for the virtual visit and that he could be  personally, fully or partially, financially responsible for it. Mr. DHerdmanexpressed understanding and agreed to proceed.   Historic Elements   Mr. NGRAYLON AMIEis a 77y.o. year old, male patient evaluated today after our last contact on 78/23/2024. Mr. DHewlett has a past medical history of Hypertension. He also  has a past surgical history that includes Hernia repair. Mr. DKokalhas a current medication list which includes the following prescription(s): aspirin ec, atorvastatin, finasteride, fluticasone, hydrocodone-acetaminophen, morphine, salicyclic acid-sulfur, tamsulosin, triamterene-hydrochlorothiazide, melatonin, and ropinirole. He  reports that he has quit smoking. He has quit using smokeless tobacco. He reports current alcohol use. He reports that he does not currently use drugs. Mr. DBennettehas No Known Allergies.  Estimated body mass index is 24.39 kg/m as calculated from the following:   Height as of 08/11/22: '5\' 10"'$  (1.778 m).   Weight as of 08/11/22: 170 lb (77.1 kg).  HPI  Today, he is being contacted for a post-procedure assessment.  The patient refers having attained good improvement of the pain and although he seems to have developed a trigger point around the right PSIS area that is giving him some pain intermittently, he refers that it is not bad enough to warrant anything else done at this time.  I reminded him that we remain available to him should he want uKoreato do a trigger point injection or evaluated further.  He understood and accepted.  Post-procedure evaluation   Type: Lumbar Facet & Sacroiliac joint Medial & Posterior Branch Radiofrequency Ablation (RFA) #2  Laterality: Right (-RT)  Level: L2, L3, L4, L5, S1, S2, & S3 Medial Branch Level(s). These levels will denervate the  L3-4, L4-5, and the L5-S1 lumbar facet joints, as well as the posterior Sacroiliac Joint area.  Imaging: Fluoroscopic  guidance Anesthesia: Local anesthesia (1-2% Lidocaine) Anxiolysis: IV Versed 2.0 mg Sedation: Moderate Sedation Fentanyl 0.5 mL (25 mcg) DOS: 08/11/2022  Performed by: Gaspar Cola, MD  Purpose: Therapeutic/Palliative Indications: Low back pain and hip pain severe enough to impact quality of life or function. Rationale (medical necessity): procedure needed and proper for the diagnosis and/or treatment of Albert Gordon medical symptoms and needs. Indications: 1. Lumbar facet syndrome (Bilateral) (L>R)   2. Chronic low back pain (1ry area of Pain) (Bilateral) (L>R) w/o sciatica   3. Chronic sacroiliac joint pain (Left)   4. Spondylosis without myelopathy or radiculopathy, lumbosacral region   5. 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 Osteoarthritis of facet joint of lumbar spine   7. Lumbar facet hypertrophy (Multilevel) (Bilateral)   8. Lumbar facet arthropathy (Multilevel) (Bilateral)   9. Enthesopathy of sacroiliac joint (Bilateral)   10. Somatic dysfunction of sacroiliac joints (Bilateral)   11. DDD (degenerative disc disease), lumbar   12. History of Vagal reaction    Pain Score: Pre-procedure: 2 /10 Post-procedure: 0-No pain/10     Effectiveness:  Initial hour after procedure: 100 %. Subsequent 4-6 hours post-procedure: 100 %. Analgesia past initial 6 hours: 75 % (lasting 1 month). Ongoing improvement:  Analgesic: The patient indicates having an ongoing 75% improvement of the right-sided low back pain.  Unfortunately, not long after the radiofrequency he broke his right foot and has been having to deal with that particular pain.  In addition, he also indicates having developed a small area of pain around the PSIS, which I suspect may be a trigger point.  He refers that it is not constant, and he thinks that he might have done something yesterday that aggravated the pain.  I offered to bring him in for a trigger point injection, but he indicated that he thinks  this cannot get better on its own and he wants to wait. Function: Albert Gordon reports improvement in function ROM: Albert Gordon reports improvement in ROM  Pharmacotherapy Assessment   Opioid Analgesic: No opioid analgesics prescribed by our practice. Morphine ER 15 mg daily (15 MME) + hydrocodone/APAP 10/325 1 tablet every 8 hours (45 MME/day) written by the "[facility]" MME/day: 45 mg/day   Monitoring: [place] PMP: PDMP reviewed during this encounter.       Pharmacotherapy: No side-effects or adverse reactions reported. Compliance: No problems identified. Effectiveness: Clinically acceptable. Plan: Refer to "POC". UDS:  Summary  Date Value Ref Range Status  01/29/2020 Note  Final    Comment:    ==================================================================== Compliance Drug Analysis, Ur ==================================================================== Test                             Result       Flag       Units  Drug Present and Declared for Prescription Verification   Morphine                       3126         EXPECTED   ng/mg creat   Normorphine                    100          EXPECTED   ng/mg creat    Potential sources of morphine  include administration of codeine or    morphine, use of heroin, or ingestion of poppy seeds.     Normorphine is an expected metabolite of morphine.    Hydrocodone                    187          EXPECTED   ng/mg creat   Dihydrocodeine                 169          EXPECTED   ng/mg creat   Norhydrocodone                 3061         EXPECTED   ng/mg creat    Sources of hydrocodone include scheduled prescription medications.    Dihydrocodeine and norhydrocodone are expected metabolites of    hydrocodone. Dihydrocodeine is also available as a scheduled    prescription medication.    Acetaminophen                  PRESENT      EXPECTED ==================================================================== Test                      Result     Flag   Units      Ref Range   Creatinine              61               mg/dL      >=20 ==================================================================== Declared Medications:  The flagging and interpretation on this report are based on the  following declared medications.  Unexpected results may arise from  inaccuracies in the declared medications.   **Note: The testing scope of this panel includes these medications:   Hydrocodone (Norco)  Morphine (MSIR)   **Note: The testing scope of this panel does not include small to  moderate amounts of these reported medications:   Acetaminophen (Norco)   **Note: The testing scope of this panel does not include the  following reported medications:   Atorvastatin (Lipitor)  Hydrochlorothiazide (Maxzide)  Tamsulosin (Flomax)  Terazosin (Hytrin)  Triamterene (Maxzide) ==================================================================== For clinical consultation, please call [phone]. ====================================================================    No results found for: "CBDTHCR", "D8THCCBX", "D9THCCBX"   Laboratory Chemistry Profile   Renal Lab Results  Component Value Date   BUN 15 01/29/2020   CREATININE 0.93 01/29/2020   BCR 16 01/29/2020   GFRAA 93 01/29/2020   GFRNONAA 80 01/29/2020    Hepatic Lab Results  Component Value Date   AST 18 01/29/2020   ALBUMIN 4.4 01/29/2020   ALKPHOS 50 01/29/2020    Electrolytes Lab Results  Component Value Date   NA 142 01/29/2020   K 4.8 01/29/2020   CL 102 01/29/2020   CALCIUM 9.4 01/29/2020   MG 2.3 01/29/2020    Bone Lab Results  Component Value Date   25OHVITD1 54 01/29/2020   25OHVITD2 <1.0 01/29/2020   25OHVITD3 54 01/29/2020    Inflammation (CRP: Acute Phase) (ESR: Chronic Phase) Lab Results  Component Value Date   CRP <1 01/29/2020   ESRSEDRATE 2 01/29/2020         Note: Above Lab results reviewed.  Imaging  DG PAIN CLINIC C-ARM 1-60 MIN  NO REPORT Fluoro was used, but no Radiologist interpretation will be provided.  Please refer to "NOTES" tab for provider progress note.  Assessment  The  primary encounter diagnosis was Chronic low back pain (1ry area of Pain) (Bilateral) (L>R) w/o sciatica. 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  Plan of Care  Problem-specific:  No problem-specific Assessment & Plan notes found for this encounter.  Albert Gordon has a current medication list which includes the following long-term medication(s): fluticasone, triamterene-hydrochlorothiazide, melatonin, and ropinirole.  Pharmacotherapy (Medications Ordered): No orders of the defined types were placed in this encounter.  Orders:  No orders of the defined types were placed in this encounter.  Follow-up plan:   Return if symptoms worsen or fail to improve.      Interventional Therapies  Risk Factors  Considerations:   WNL   Planned  Pending:      Under consideration:   Diagnostic/therapeutic bilateral cervical facet MBB #2  Diagnostic/therapeutic right L3 and L4 TFESI #1    Completed:   Diagnostic bilateral cervical facet MBB x1 (06/26/2021) (100/100/50/50)  Diagnostic bilateral lumbar facet MBB x2 (05/23/2020) (100/100/70/>50)  Diagnostic bilateral SI joint Blk x2 (02/13/2021) (100/100/100 x3 to 4 days/50)  Therapeutic left lumbar facet RFA x2 (07/28/2022) (100/100/50/75)  Therapeutic left SI joint RFA x2 (07/28/2022) (100/100/50/75)  Therapeutic right lumbar facet RFA x2 (08/11/2022) (100/100/75/75)  Therapeutic right SI joint RFA x2 (08/11/2022) (100/100/75/75)    Therapeutic  Palliative (PRN) options:   Palliative bilateral lumbar facet RFA #3  Palliative bilateral SI joint RFA #3       Recent Visits Date Type Provider Dept  08/11/22 Procedure visit Milinda Pointer, MD  [facility]  07/28/22 Procedure visit Milinda Pointer, MD [facility]  07/08/22 Office Visit Milinda Pointer, MD [facility]  Showing recent visits within past 90 days and meeting all other requirements Today's Visits Date Type Provider Dept  09/22/22 Office Visit Milinda Pointer, MD [facility]  Showing today's visits and meeting all other requirements Future Appointments No visits were found meeting these conditions. Showing future appointments within next 90 days and meeting all other requirements  I discussed the assessment and treatment plan with the patient. The patient was provided an opportunity to ask questions and all were answered. The patient agreed with the plan and demonstrated an understanding of the instructions.  Patient advised to call back or seek an in-person evaluation if the symptoms or condition worsens.  Duration of encounter: 14 minutes.  Note by: Gaspar Cola, MD Date: 09/22/2022; Time: 11:46 AM
